# Patient Record
Sex: Male | Born: 1955
Health system: Southern US, Community
[De-identification: ages and names within clinical notes are randomized; demographics above are authoritative.]

## PROBLEM LIST (undated history)

## (undated) DIAGNOSIS — M109 Gout, unspecified: Secondary | ICD-10-CM

## (undated) DIAGNOSIS — I10 Essential (primary) hypertension: Secondary | ICD-10-CM

## (undated) DIAGNOSIS — E119 Type 2 diabetes mellitus without complications: Secondary | ICD-10-CM

---

## 2001-05-12 ENCOUNTER — Ambulatory Visit: Admission: RE | Admit: 2001-05-12 | Discharge: 2001-05-12 | Payer: Self-pay | Admitting: Family Medicine

## 2006-04-19 ENCOUNTER — Ambulatory Visit: Payer: Self-pay | Admitting: Internal Medicine

## 2010-01-18 ENCOUNTER — Emergency Department (HOSPITAL_COMMUNITY)
Admission: EM | Admit: 2010-01-18 | Discharge: 2010-01-18 | Payer: Self-pay | Source: Home / Self Care | Admitting: Emergency Medicine

## 2013-11-01 ENCOUNTER — Telehealth: Payer: Self-pay | Admitting: *Deleted

## 2013-11-01 NOTE — Telephone Encounter (Signed)
Received call from patient requesting an appointment with Dr. Aline Brochure for fluid on right knee. The patient is a patient of Dr. Luna Glasgow, and he has never seen Dr. Aline Brochure before. I advised the patient to go to primary care doctor or the emergency room. I advised that since he was not an ER follow up or have a fracture that Dr. Aline Brochure would not be able to see him today.

## 2013-11-03 ENCOUNTER — Emergency Department (HOSPITAL_COMMUNITY)
Admission: EM | Admit: 2013-11-03 | Discharge: 2013-11-03 | Disposition: A | Discharge status: Home / Self Care | Payer: 59 | Attending: Emergency Medicine | Admitting: Emergency Medicine

## 2013-11-03 ENCOUNTER — Encounter (HOSPITAL_COMMUNITY): Payer: Self-pay | Admitting: Emergency Medicine

## 2013-11-03 DIAGNOSIS — Z79899 Other long term (current) drug therapy: Secondary | ICD-10-CM | POA: Diagnosis not present

## 2013-11-03 DIAGNOSIS — Z791 Long term (current) use of non-steroidal anti-inflammatories (NSAID): Secondary | ICD-10-CM | POA: Insufficient documentation

## 2013-11-03 DIAGNOSIS — M25561 Pain in right knee: Secondary | ICD-10-CM | POA: Diagnosis present

## 2013-11-03 DIAGNOSIS — E119 Type 2 diabetes mellitus without complications: Secondary | ICD-10-CM | POA: Insufficient documentation

## 2013-11-03 DIAGNOSIS — M10061 Idiopathic gout, right knee: Secondary | ICD-10-CM | POA: Insufficient documentation

## 2013-11-03 DIAGNOSIS — M109 Gout, unspecified: Secondary | ICD-10-CM

## 2013-11-03 DIAGNOSIS — I10 Essential (primary) hypertension: Secondary | ICD-10-CM | POA: Diagnosis not present

## 2013-11-03 HISTORY — DX: Gout, unspecified: M10.9

## 2013-11-03 HISTORY — DX: Type 2 diabetes mellitus without complications: E11.9

## 2013-11-03 HISTORY — DX: Essential (primary) hypertension: I10

## 2013-11-03 MED ORDER — KETOROLAC TROMETHAMINE 60 MG/2ML IM SOLN
60.0000 mg | Freq: Once | INTRAMUSCULAR | Status: AC
  Administered 2013-11-03: 60 mg via INTRAMUSCULAR
  Filled 2013-11-03: qty 2

## 2013-11-03 MED ORDER — INDOMETHACIN 50 MG PO CAPS: 50.0000 mg | ORAL_CAPSULE | Freq: Three times a day (TID) | ORAL | Status: DC

## 2013-11-03 NOTE — ED Notes (Signed)
Pt states he has been having increasing pain in his right knee since last week. Pt has history of gout and feels this is the same.

## 2013-11-03 NOTE — Discharge Instructions (Signed)
Gout Gout is when your joints become red, sore, and swell (inflamed). This is caused by the buildup of uric acid crystals in the joints. Uric acid is a chemical that is normally in the blood. If the level of uric acid gets too high in the blood, these crystals form in your joints and tissues. Over time, these crystals can form into masses near the joints and tissues. These masses can destroy bone and cause the bone to look misshapen (deformed). HOME CARE   Do not take aspirin for pain.  Only take medicine as told by your doctor.  Rest the joint as much as you can. When in bed, keep sheets and blankets off painful areas.  Keep the sore joints raised (elevated).  Put warm or cold packs on painful joints. Use of warm or cold packs depends on which works best for you.  Use crutches if the painful joint is in your leg.  Drink enough fluids to keep your pee (urine) clear or pale yellow. Limit alcohol, sugary drinks, and drinks with fructose in them.  Follow your diet instructions. Pay careful attention to how much protein you eat. Include fruits, vegetables, whole grains, and fat-free or low-fat milk products in your daily diet. Talk to your doctor or dietitian about the use of coffee, vitamin C, and cherries. These may help lower uric acid levels.  Keep a healthy body weight. GET HELP RIGHT AWAY IF:   You have watery poop (diarrhea), throw up (vomit), or have any side effects from medicines.  You do not feel better in 24 hours, or you are getting worse.  Your joint becomes suddenly more tender, and you have chills or a fever. MAKE SURE YOU:   Understand these instructions.  Will watch your condition.  Will get help right away if you are not doing well or get worse. Document Released: 10/10/2007 Document Revised: 05/17/2013 Document Reviewed: 08/14/2011 [facility] Patient Information 2015 [place], [place]. This information is not intended to replace advice given to you by your health care  provider. Make sure you discuss any questions you have with your health care provider.  Knee Pain Knee pain can be a result of an injury or other medical conditions. Treatment will depend on the cause of your pain. HOME CARE  Only take medicine as told by your doctor.  Keep a healthy weight. Being overweight can make the knee hurt more.  Stretch before exercising or playing sports.  If there is constant knee pain, change the way you exercise. Ask your doctor for advice.  Make sure shoes fit well. Choose the right shoe for the sport or activity.  Protect your knees. Wear kneepads if needed.  Rest when you are tired. GET HELP RIGHT AWAY IF:   Your knee pain does not stop.  Your knee pain does not get better.  Your knee joint feels hot to the touch.  You have a fever. MAKE SURE YOU:   Understand these instructions.  Will watch this condition.  Will get help right away if you are not doing well or get worse. Document Released: 03/29/2008 Document Revised: 03/25/2011 Document Reviewed: 03/29/2008 [facility] Patient Information 2015 [place], [place]. This information is not intended to replace advice given to you by your health care provider. Make sure you discuss any questions you have with your health care provider.

## 2013-11-05 NOTE — ED Provider Notes (Signed)
CSN: [id number]     Arrival date & time 11/03/13  1323 History   First MD Initiated Contact with Patient 11/03/13 1335     Chief Complaint  Patient presents with  . Knee Pain     (Consider location/radiation/quality/duration/timing/severity/associated sxs/prior Treatment) HPI  Jared Vazquez is a 58 y.o. male who presents to the Emergency Department complaining of right knee pain for one week.  He states that he has hx of gout and this pain feels similar to previous.  He also states that he has been eating foods that have exacerbated his gout in the past.    Pain is worse with movement of the knee and improves at rest.  He denies fever, chills, redness or injury.  He states that allopurinol has not helped with the pain.   Past Medical History  Diagnosis Date  . Gout   . Hypertension   . Diabetes mellitus without complication    History reviewed. No pertinent past surgical history. History reviewed. No pertinent family history. History  Substance Use Topics  . Smoking status: Never Smoker   . Smokeless tobacco: Not on file  . Alcohol Use: No    Review of Systems  Constitutional: Negative for fever and chills.  Genitourinary: Negative for dysuria and difficulty urinating.  Musculoskeletal: Positive for arthralgias. Negative for joint swelling.       Knee pain  Skin: Negative for color change and wound.  All other systems reviewed and are negative.     Allergies  Review of patient's allergies indicates no known allergies.  Home Medications   Prior to Admission medications   Medication Sig Start Date End Date Taking? Authorizing Provider  allopurinol (ZYLOPRIM) 100 MG tablet Take 100 mg by mouth daily.   Yes Historical Provider, MD  metFORMIN (GLUCOPHAGE) 500 MG tablet Take 500 mg by mouth daily with breakfast.   Yes Historical Provider, MD  nebivolol (BYSTOLIC) 10 MG tablet Take 10 mg by mouth daily.   Yes Historical Provider, MD  indomethacin (INDOCIN) 50 MG capsule  Take 1 capsule (50 mg total) by mouth 3 (three) times daily with meals. Discontinue when pain is tolerable 11/03/13   Caidance Sybert L. Timmy Bubeck, PA-C   BP 143/80  Pulse 74  Temp(Src) 98 F (36.7 C) (Oral)  Resp 16  Ht 6' (1.829 m)  Wt 206 lb (93.441 kg)  BMI 27.93 kg/m2  SpO2 100% Physical Exam  Nursing note and vitals reviewed. Constitutional: He is oriented to person, place, and time. He appears well-developed and well-nourished. No distress.  Cardiovascular: Normal rate, regular rhythm, normal heart sounds and intact distal pulses.   No murmur heard. Pulmonary/Chest: Effort normal and breath sounds normal. No respiratory distress.  Musculoskeletal: He exhibits tenderness. He exhibits no edema.  ttp of the right anterior knee.  No erythema, effusion, or step-off deformity.  DP pulse brisk, distal sensation intact. Calf is soft and NT. Pt has full ROM of the knee.    Neurological: He is alert and oriented to person, place, and time. He exhibits normal muscle tone. Coordination normal.  Skin: Skin is warm and dry. No erythema.    ED Course  Procedures (including critical care time) Labs Review Labs Reviewed - No data to display  Imaging Review No results found.   EKG Interpretation None      MDM   Final diagnoses:  Knee pain, acute, right  Gout of right knee, unspecified cause, unspecified chronicity    Patient with h/o gout and reports  that pain to the knee feels same as previous gout flares.  No concerning sx's for septic joint.  Pt has full ROM, NV intact.  He agrees to close f/u with his PMD, rx for indocin and also advised to return here if any worsening sx's develop.  He ambulates with steady gait, appears stable for d/c    Aloria Looper L. Vanessa [place], PA-C 11/05/13 2157

## 2013-11-09 NOTE — ED Provider Notes (Signed)
Medical screening examination/treatment/procedure(s) were performed by non-physician practitioner and as supervising physician I was immediately available for consultation/collaboration.   EKG Interpretation None        Maudry Diego, MD 11/09/13 [phone]

## 2014-12-24 ENCOUNTER — Encounter (HOSPITAL_COMMUNITY): Payer: Self-pay

## 2014-12-24 ENCOUNTER — Emergency Department (HOSPITAL_COMMUNITY)
Admission: EM | Admit: 2014-12-24 | Discharge: 2014-12-24 | Disposition: A | Discharge status: Home / Self Care | Payer: 59 | Attending: Emergency Medicine | Admitting: Emergency Medicine

## 2014-12-24 DIAGNOSIS — H6122 Impacted cerumen, left ear: Secondary | ICD-10-CM | POA: Diagnosis not present

## 2014-12-24 DIAGNOSIS — Z8739 Personal history of other diseases of the musculoskeletal system and connective tissue: Secondary | ICD-10-CM | POA: Diagnosis not present

## 2014-12-24 DIAGNOSIS — J3489 Other specified disorders of nose and nasal sinuses: Secondary | ICD-10-CM | POA: Diagnosis not present

## 2014-12-24 DIAGNOSIS — I10 Essential (primary) hypertension: Secondary | ICD-10-CM | POA: Insufficient documentation

## 2014-12-24 DIAGNOSIS — E119 Type 2 diabetes mellitus without complications: Secondary | ICD-10-CM | POA: Diagnosis not present

## 2014-12-24 DIAGNOSIS — Z79899 Other long term (current) drug therapy: Secondary | ICD-10-CM | POA: Diagnosis not present

## 2014-12-24 DIAGNOSIS — H9202 Otalgia, left ear: Secondary | ICD-10-CM | POA: Diagnosis present

## 2014-12-24 MED ORDER — NEOMYCIN-POLYMYXIN-HC 1 % OT SOLN
3.0000 [drp] | Freq: Four times a day (QID) | OTIC | Status: DC
  Administered 2014-12-24: 3 [drp] via OTIC
  Filled 2014-12-24: qty 10

## 2014-12-24 NOTE — ED Notes (Signed)
This nurse attempted to remove ears wax- Using tepid water and hydrogen peroxide solution. Minimal results- ear becoming more painful for pt - PA Idol informed and in room at this time with pt

## 2014-12-24 NOTE — ED Notes (Signed)
I think I have something in my left ear. I have tried to get it out but I can't and it is hurting. It has been there for a couple of days.

## 2014-12-24 NOTE — ED Notes (Signed)
Discharge instructions reviewed with pt - Discussed how to use ear drops provided - - Pt repeated back instructions - Also informed pt importance of not cleaning ears with q tip and why . Verbalized understanding . Ambulated off unit alone

## 2014-12-24 NOTE — Discharge Instructions (Signed)
Cerumen Impaction The structures of the external ear canal secrete a waxy substance known as cerumen. Excess cerumen can build up in the ear canal, causing a condition known as cerumen impaction. Cerumen impaction can cause ear pain and disrupt the function of the ear. The rate of cerumen production differs for each individual. In certain individuals, the configuration of the ear canal may decrease his or her ability to naturally remove cerumen. CAUSES Cerumen impaction is caused by excessive cerumen production or buildup. RISK FACTORS  Frequent use of swabs to clean ears.  Having narrow ear canals.  Having eczema.  Being dehydrated. SIGNS AND SYMPTOMS  Diminished hearing.  Ear drainage.  Ear pain.  Ear itch. TREATMENT Treatment may involve:  Over-the-counter or prescription ear drops to soften the cerumen.  Removal of cerumen by a health care provider. This may be done with:  Irrigation with warm water. This is the most common method of removal.  Ear curettes and other instruments.  Surgery. This may be done in severe cases. HOME CARE INSTRUCTIONS  Take medicines only as directed by your health care provider.  Do not insert objects into the ear with the intent of cleaning the ear. PREVENTION  Do not insert objects into the ear, even with the intent of cleaning the ear. Removing cerumen as a part of normal hygiene is not necessary, and the use of swabs in the ear canal is not recommended.  Drink enough water to keep your urine clear or pale yellow.  Control your eczema if you have it. SEEK MEDICAL CARE IF:  You develop ear pain.  You develop bleeding from the ear.  The cerumen does not clear after you use ear drops as directed.   This information is not intended to replace advice given to you by your health care provider. Make sure you discuss any questions you have with your health care provider.   Document Released: 02/08/2004 Document Revised: 01/21/2014  Document Reviewed: 08/17/2014 Elsevier Interactive Patient Education 2016 [place] 3 drops of the cortisporin in your left ear every 6 hours for the next 4-5 days.

## 2014-12-26 NOTE — ED Provider Notes (Signed)
CSN: [id number]     Arrival date & time 12/24/14  2020 History   First MD Initiated Contact with Patient 12/24/14 2044     Chief Complaint  Patient presents with  . Foreign Body in [place]     (Consider location/radiation/quality/duration/timing/severity/associated sxs/prior Treatment) The history is provided by the patient.   Jared Vazquez is a 59 y.o. male presenting with a several day history of increasing pain and pressure with decreased hearing acuity in his left ear.  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  There has been no drainage from the ear.  He has tried using a qtip and flushing without success of removing any objects.    Past Medical History  Diagnosis Date  . Gout   . Hypertension   . Diabetes mellitus without complication ([place])    History reviewed. No pertinent past surgical history. No family history on file. Social History  Substance Use Topics  . Smoking status: Never Smoker   . Smokeless tobacco: None  . Alcohol Use: No    Review of Systems  Constitutional: Negative for fever.  HENT: Positive for ear pain and hearing loss. Negative for congestion, ear discharge, rhinorrhea, sinus pressure and sore throat.   Eyes: Negative.   Skin: Negative.  Negative for rash and wound.  Neurological: Negative for dizziness and headaches.      Allergies  Review of patient's allergies indicates no known allergies.  Home Medications   Prior to Admission medications   Medication Sig Start Date End Date Taking? Authorizing Provider  metFORMIN (GLUCOPHAGE) 500 MG tablet Take 500 mg by mouth daily with breakfast.   Yes Historical Provider, MD  nebivolol (BYSTOLIC) 10 MG tablet Take 10 mg by mouth daily.   Yes Historical Provider, MD  TRIBENZOR 40-10-25 MG TABS Take 1 tablet by mouth daily. 11/21/14  Yes Historical Provider, MD   BP 130/80 mmHg  Pulse 72  Temp(Src) 98.1 F (36.7 C) (Oral)  Resp 18  Ht 6' (1.829 m)  Wt 93.441 kg  BMI 27.93  kg/m2  SpO2 98% Physical Exam  Constitutional: He is oriented to person, place, and time. He appears well-developed and well-nourished.  HENT:  Head: Normocephalic and atraumatic.  Right Ear: Ear canal normal.  Left Ear: Tympanic membrane and ear canal normal.  Nose: Mucosal edema and rhinorrhea present.  Mouth/Throat: Uvula is midline, oropharynx is clear and moist and mucous membranes are normal. No oropharyngeal exudate, posterior oropharyngeal edema, posterior oropharyngeal erythema or tonsillar abscesses.  Left cerumen impaction.  Mild external canal erythema without edema. Cerumen in right ear as well without impaction.  Cardiovascular: Normal rate.   Pulmonary/Chest: Effort normal. No respiratory distress.  Neurological: He is alert and oriented to person, place, and time.  Skin: Skin is warm and dry. No rash noted.    ED Course  .Ear Cerumen Removal Date/Time: 12/25/2014 10:00 PM Performed by: Evalee Jefferson Authorized by: Evalee Jefferson Risks and benefits: risks, benefits and alternatives were discussed Patient identity confirmed: verbally with patient Time out: Immediately prior to procedure a "time out" was called to verify the correct patient, procedure, equipment, support staff and site/side marked as required. Local anesthetic: none Ceruminolytics applied: Ceruminolytics applied prior to the procedure. Location details: left ear Procedure type: irrigation Patient sedated: no Patient tolerance: Patient tolerated the procedure well with no immediate complications Comments: Moderate cerumen removed after multiple flushes using warm tap water.  Small amount of residual cerumen left, TM  visualized and normal.  Erythema without edema of external canal.  Hearing improved.   (including critical care time) Labs Review Labs Reviewed - No data to display  Imaging Review No results found. I have personally reviewed and evaluated these images and lab results as part of my medical  decision-making.   EKG Interpretation None      MDM   Final diagnoses:  Cerumen impaction, left    Pt tolerated flushing well. Hearing improved. Given external canal inflammation, cortisporin gtts applied and given pt for use for the next several days.  Prn f/u anticipated. Advised to avoid using qtips.    Evalee Jefferson, PA-C 12/26/14 1358  Merrily Pew, MD 12/27/14 1126

## 2015-01-31 DIAGNOSIS — I10 Essential (primary) hypertension: Secondary | ICD-10-CM | POA: Diagnosis not present

## 2015-01-31 DIAGNOSIS — E118 Type 2 diabetes mellitus with unspecified complications: Secondary | ICD-10-CM | POA: Diagnosis not present

## 2015-01-31 DIAGNOSIS — M791 Myalgia: Secondary | ICD-10-CM | POA: Diagnosis not present

## 2015-01-31 MED FILL — COLCHICINE 0.6 MG TABLET: 0.6 | 10 days supply | Qty: 20 | Fill #0

## 2015-01-31 MED FILL — ALLOPURINOL 100 MG TABLET: 100 | 30 days supply | Qty: 30 | Fill #0

## 2015-01-31 MED FILL — HYDROCOD-IBU 7.5-200 TAB: 7.5-200 | 15 days supply | Qty: 60 | Fill #0

## 2015-01-31 MED FILL — CYCLOBENZAPRINE 5 MG TABLET: 5 | 10 days supply | Qty: 30 | Fill #0

## 2015-02-06 DIAGNOSIS — M5416 Radiculopathy, lumbar region: Secondary | ICD-10-CM | POA: Diagnosis not present

## 2015-02-06 DIAGNOSIS — M5136 Other intervertebral disc degeneration, lumbar region: Secondary | ICD-10-CM | POA: Diagnosis not present

## 2015-02-06 DIAGNOSIS — M62830 Muscle spasm of back: Secondary | ICD-10-CM | POA: Diagnosis not present

## 2015-02-06 DIAGNOSIS — M9903 Segmental and somatic dysfunction of lumbar region: Secondary | ICD-10-CM | POA: Diagnosis not present

## 2015-03-13 MED FILL — metFORMIN HCL 500 MG TABS: 500 | 90 days supply | Qty: 90 | Fill #0

## 2015-03-13 MED FILL — OLMSRTN-AMLDPN-HCTZ 40-10-2: 40-10-25 | 90 days supply | Qty: 90 | Fill #0

## 2015-03-13 MED FILL — ALLOPURINOL 100 MG TABLET: 100 | 30 days supply | Qty: 30 | Fill #1

## 2015-03-16 MED FILL — BYSTOLIC 10 MG TABLET: 10 | 90 days supply | Qty: 180 | Fill #0

## 2015-06-30 MED FILL — BYSTOLIC 10 MG TABLET: 10 | 90 days supply | Qty: 180 | Fill #1

## 2015-06-30 MED FILL — OLMSRTN-AMLDPN-HCTZ 40-10-2: 40-10-25 | 90 days supply | Qty: 90 | Fill #1

## 2015-06-30 MED FILL — metFORMIN HCL 500 MG TABS: 500 | 90 days supply | Qty: 90 | Fill #1

## 2015-07-10 ENCOUNTER — Emergency Department (HOSPITAL_COMMUNITY): Payer: 59

## 2015-07-10 ENCOUNTER — Encounter (HOSPITAL_COMMUNITY): Payer: Self-pay | Admitting: Emergency Medicine

## 2015-07-10 ENCOUNTER — Other Ambulatory Visit: Payer: Self-pay

## 2015-07-10 ENCOUNTER — Emergency Department (HOSPITAL_COMMUNITY)
Admission: EM | Admit: 2015-07-10 | Discharge: 2015-07-10 | Disposition: A | Discharge status: Home / Self Care | Payer: 59 | Attending: Emergency Medicine | Admitting: Emergency Medicine

## 2015-07-10 DIAGNOSIS — Z7984 Long term (current) use of oral hypoglycemic drugs: Secondary | ICD-10-CM | POA: Insufficient documentation

## 2015-07-10 DIAGNOSIS — E119 Type 2 diabetes mellitus without complications: Secondary | ICD-10-CM | POA: Diagnosis not present

## 2015-07-10 DIAGNOSIS — R42 Dizziness and giddiness: Secondary | ICD-10-CM | POA: Insufficient documentation

## 2015-07-10 DIAGNOSIS — I10 Essential (primary) hypertension: Secondary | ICD-10-CM | POA: Diagnosis not present

## 2015-07-10 DIAGNOSIS — Z79899 Other long term (current) drug therapy: Secondary | ICD-10-CM | POA: Insufficient documentation

## 2015-07-10 LAB — CBC WITH DIFFERENTIAL/PLATELET
BASOS PCT: 0 %
Basophils Absolute: 0 10*3/uL (ref 0.0–0.1)
Eosinophils Absolute: 0.3 10*3/uL (ref 0.0–0.7)
Eosinophils Relative: 3 %
HEMATOCRIT: 45.4 % (ref 39.0–52.0)
HEMOGLOBIN: 15.3 g/dL (ref 13.0–17.0)
LYMPHS ABS: 1.8 10*3/uL (ref 0.7–4.0)
LYMPHS PCT: 22 %
MCH: 29.2 pg (ref 26.0–34.0)
MCHC: 33.7 g/dL (ref 30.0–36.0)
MCV: 86.6 fL (ref 78.0–100.0)
MONO ABS: 0.6 10*3/uL (ref 0.1–1.0)
MONOS PCT: 7 %
NEUTROS ABS: 5.6 10*3/uL (ref 1.7–7.7)
Neutrophils Relative %: 68 %
Platelets: 239 10*3/uL (ref 150–400)
RBC: 5.24 MIL/uL (ref 4.22–5.81)
RDW: 12.8 % (ref 11.5–15.5)
WBC: 8.2 10*3/uL (ref 4.0–10.5)

## 2015-07-10 LAB — I-STAT TROPONIN, ED: Troponin i, poc: 0.01 ng/mL (ref 0.00–0.08)

## 2015-07-10 LAB — BASIC METABOLIC PANEL
Anion gap: 5 (ref 5–15)
BUN: 20 mg/dL (ref 6–20)
CALCIUM: 9 mg/dL (ref 8.9–10.3)
CHLORIDE: 100 mmol/L — AB (ref 101–111)
CO2: 31 mmol/L (ref 22–32)
CREATININE: 1.56 mg/dL — AB (ref 0.61–1.24)
GFR calc Af Amer: 54 mL/min — ABNORMAL LOW (ref >60)
GFR calc non Af Amer: 47 mL/min — ABNORMAL LOW (ref >60)
GLUCOSE: 193 mg/dL — AB (ref 65–99)
Potassium: 3.6 mmol/L (ref 3.5–5.1)
Sodium: 136 mmol/L (ref 135–145)

## 2015-07-10 LAB — HEPATIC FUNCTION PANEL
ALK PHOS: 66 U/L (ref 38–126)
ALT: 12 U/L — AB (ref 17–63)
AST: 15 U/L (ref 15–41)
Albumin: 3.8 g/dL (ref 3.5–5.0)
BILIRUBIN DIRECT: 0.2 mg/dL (ref 0.1–0.5)
BILIRUBIN INDIRECT: 0.8 mg/dL (ref 0.3–0.9)
Total Bilirubin: 1 mg/dL (ref 0.3–1.2)
Total Protein: 7.5 g/dL (ref 6.5–8.1)

## 2015-07-10 MED ORDER — ONDANSETRON HCL 4 MG/2ML IJ SOLN
4.0000 mg | Freq: Once | INTRAMUSCULAR | Status: AC
  Administered 2015-07-10: 4 mg via INTRAVENOUS
  Filled 2015-07-10: qty 2

## 2015-07-10 MED ORDER — MECLIZINE HCL 25 MG PO TABS: 25.0000 mg | ORAL_TABLET | Freq: Three times a day (TID) | ORAL | Status: DC | PRN

## 2015-07-10 MED ORDER — MECLIZINE HCL 12.5 MG PO TABS
25.0000 mg | ORAL_TABLET | Freq: Once | ORAL | Status: AC
  Administered 2015-07-10: 25 mg via ORAL
  Filled 2015-07-10: qty 2

## 2015-07-10 MED ORDER — ONDANSETRON 4 MG PO TBDP: ORAL_TABLET | ORAL | Status: DC

## 2015-07-10 MED ORDER — ONDANSETRON 4 MG PO TBDP
ORAL_TABLET | ORAL | Status: DC
Start: 2015-07-10 — End: 2017-09-16

## 2015-07-10 MED ORDER — SODIUM CHLORIDE 0.9 % IV BOLUS (SEPSIS)
1000.0000 mL | Freq: Once | INTRAVENOUS | Status: AC
  Administered 2015-07-10: 1000 mL via INTRAVENOUS

## 2015-07-10 NOTE — ED Provider Notes (Addendum)
CSN: [id number]     Arrival date & time 07/10/15  1133 History  By signing my name below, I, Irene Pap, attest that this documentation has been prepared under the direction and in the presence of Milton Ferguson, MD. Electronically Signed: Irene Pap, ED Scribe. 07/10/2015. 12:17 PM.   Chief Complaint  Patient presents with  . Dizziness   Patient is a 60 y.o. male presenting with dizziness. The history is provided by the patient (Patient states that he started today feeling like the room was spinning). No language interpreter was used.  Dizziness Quality:  Room spinning and head spinning Severity:  Moderate Onset quality:  Sudden Duration:  1 day Timing:  Constant Progression:  Worsening Chronicity:  New Context: standing up   Ineffective treatments:  None tried Associated symptoms: no chest pain, no diarrhea, no headaches, no nausea and no vomiting   HPI Comments: Jared Vazquez is a 60 y.o. Male with a hx of HTN and DM who presents to the Emergency Department complaining of room spinning dizziness onset one day ago. He reports worsening dizziness with standing. Pt denies hx of similar symptoms. He has not taken anything for his symptoms. He denies nausea or vomiting.   Past Medical History  Diagnosis Date  . Gout   . Hypertension   . Diabetes mellitus without complication ([place])    History reviewed. No pertinent past surgical history. History reviewed. No pertinent family history. Social History  Substance Use Topics  . Smoking status: Never Smoker   . Smokeless tobacco: None  . Alcohol Use: Yes     Comment: occasionally    Review of Systems  Constitutional: Negative for appetite change and fatigue.  HENT: Negative for congestion, ear discharge and sinus pressure.   Eyes: Negative for discharge.  Respiratory: Negative for cough.   Cardiovascular: Negative for chest pain.  Gastrointestinal: Negative for nausea, vomiting, abdominal pain and diarrhea.  Genitourinary:  Negative for frequency and hematuria.  Musculoskeletal: Negative for back pain and gait problem.  Skin: Negative for rash.  Neurological: Positive for dizziness. Negative for seizures and headaches.  Psychiatric/Behavioral: Negative for hallucinations.   Allergies  Review of patient's allergies indicates no known allergies.  Home Medications   Prior to Admission medications   Medication Sig Start Date End Date Taking? Authorizing Provider  metFORMIN (GLUCOPHAGE) 500 MG tablet Take 500 mg by mouth daily with breakfast.    Historical Provider, MD  nebivolol (BYSTOLIC) 10 MG tablet Take 10 mg by mouth daily.    Historical Provider, MD  TRIBENZOR 40-10-25 MG TABS Take 1 tablet by mouth daily. 11/21/14   Historical Provider, MD   BP 142/94 mmHg  Pulse 55  Temp(Src) 98 F (36.7 C) (Oral)  Resp 18  Ht 6' (1.829 m)  Wt 204 lb (92.534 kg)  BMI 27.66 kg/m2  SpO2 100% Physical Exam  Constitutional: He is oriented to person, place, and time. He appears well-developed.  HENT:  Head: Normocephalic.  Eyes: Conjunctivae and EOM are normal. No scleral icterus.  Neck: Neck supple. No thyromegaly present.  Cardiovascular: Normal rate and regular rhythm.  Exam reveals no gallop and no friction rub.   No murmur heard. Pulmonary/Chest: No stridor. He has no wheezes. He has no rales. He exhibits no tenderness.  Abdominal: He exhibits no distension. There is no tenderness. There is no rebound.  Musculoskeletal: Normal range of motion. He exhibits no edema.  Lymphadenopathy:    He has no cervical adenopathy.  Neurological: He is oriented  to person, place, and time. He exhibits normal muscle tone. Coordination normal.  Skin: No rash noted. No erythema.  Psychiatric: He has a normal mood and affect. His behavior is normal.    ED Course  Procedures (including critical care time) DIAGNOSTIC STUDIES: Oxygen Saturation is 100% on RA, normal by my interpretation.    COORDINATION OF CARE: 12:16  PM-Discussed treatment plan which includes labs and EKG with pt at bedside and pt agreed to plan.    Labs Review Labs Reviewed  CBC WITH DIFFERENTIAL/PLATELET  BASIC METABOLIC PANEL    Imaging Review No results found. I have personally reviewed and evaluated these images and lab results as part of my medical decision-making.   EKG Interpretation   Date/Time:  Monday July 10 2015 11:50:02 EDT Ventricular Rate:  53 PR Interval:  142 QRS Duration: 84 QT Interval:  418 QTC Calculation: 392 R Axis:   3 Text Interpretation:  Sinus bradycardia Nonspecific T wave abnormality  Abnormal ECG Confirmed by Raider Valbuena  MD, Leldon Steege ([id number]) on 07/10/2015 12:07:35  PM      MDM   Final diagnoses:  None    Labs EKG CT of the head all unremarkable. Patient improved with Antivert. Will treat patient for vertigo with Antivert and he will follow-up with his PCP.  The chart was scribed for me under my direct supervision.  I personally performed the history, physical, and medical decision making and all procedures in the evaluation of this patient.Milton Ferguson, MD 07/10/15 1707  Milton Ferguson, MD 07/10/15 [phone]

## 2015-07-10 NOTE — ED Notes (Signed)
Patient complaining of dizziness since yesterday. Denies any other symptoms. Patient has symmetrical facial movements, equal strength bilaterally.

## 2015-07-10 NOTE — ED Notes (Signed)
EDP at bedside  

## 2015-07-10 NOTE — Discharge Instructions (Signed)
Follow-up with your family doctor later this week for recheck

## 2015-07-10 NOTE — ED Notes (Signed)
Pt given pre pack of zofran at discharge per order of EDP.

## 2015-07-17 MED FILL — Ondansetron HCl Tab 4 MG: ORAL | Qty: 4 | Status: AC

## 2015-07-25 DIAGNOSIS — H52221 Regular astigmatism, right eye: Secondary | ICD-10-CM | POA: Diagnosis not present

## 2015-07-25 DIAGNOSIS — H524 Presbyopia: Secondary | ICD-10-CM | POA: Diagnosis not present

## 2015-07-25 DIAGNOSIS — H5213 Myopia, bilateral: Secondary | ICD-10-CM | POA: Diagnosis not present

## 2015-07-25 DIAGNOSIS — H2513 Age-related nuclear cataract, bilateral: Secondary | ICD-10-CM | POA: Diagnosis not present

## 2015-08-07 DIAGNOSIS — M1A00X0 Idiopathic chronic gout, unspecified site, without tophus (tophi): Secondary | ICD-10-CM | POA: Diagnosis not present

## 2015-08-07 DIAGNOSIS — M25561 Pain in right knee: Secondary | ICD-10-CM | POA: Diagnosis not present

## 2015-08-07 DIAGNOSIS — M25571 Pain in right ankle and joints of right foot: Secondary | ICD-10-CM | POA: Diagnosis not present

## 2015-08-07 DIAGNOSIS — I10 Essential (primary) hypertension: Secondary | ICD-10-CM | POA: Diagnosis not present

## 2015-08-07 DIAGNOSIS — M25562 Pain in left knee: Secondary | ICD-10-CM | POA: Diagnosis not present

## 2015-08-07 DIAGNOSIS — E118 Type 2 diabetes mellitus with unspecified complications: Secondary | ICD-10-CM | POA: Diagnosis not present

## 2015-08-07 MED FILL — ALLOPURINOL 100 MG TABLET: 100 | 30 days supply | Qty: 30 | Fill #2

## 2015-08-07 MED FILL — METHYLPREDNISOLONE 4 MG TAB: 4 | 6 days supply | Qty: 21 | Fill #0

## 2015-10-11 MED FILL — ALLOPURINOL 100 MG TABLET: 100 | 30 days supply | Qty: 30 | Fill #0

## 2015-10-11 MED FILL — BYSTOLIC 20 MG TABLET: 20 | 30 days supply | Qty: 30 | Fill #0

## 2015-10-12 MED FILL — OLMSRTN-AMLDPN-HCTZ 40-10-2: 40-10-25 | 90 days supply | Qty: 90 | Fill #0

## 2015-10-18 MED FILL — metFORMIN HCL 500 MG TABS: 500 | 90 days supply | Qty: 90 | Fill #0

## 2015-11-16 MED FILL — BYSTOLIC 20 MG TABLET: 20 | 90 days supply | Qty: 90 | Fill #1

## 2016-01-19 MED FILL — metFORMIN HCL 500 MG TABS: 500 | 90 days supply | Qty: 90 | Fill #1

## 2016-01-19 MED FILL — ALLOPURINOL 100 MG TABLET: 100 | 30 days supply | Qty: 30 | Fill #1

## 2016-01-19 MED FILL — OLMSRTN-AMLDPN-HCTZ 40-10-2: 40-10-25 | 90 days supply | Qty: 90 | Fill #1

## 2016-04-16 MED FILL — ALLOPURINOL 100 MG TABLET: 100 | 30 days supply | Qty: 30 | Fill #2

## 2016-04-16 MED FILL — BYSTOLIC 10 MG TABLET: 10 | 30 days supply | Qty: 60 | Fill #0

## 2016-06-03 MED FILL — ALLOPURINOL 100 MG TABLET: 100 | 30 days supply | Qty: 30 | Fill #3

## 2016-06-03 MED FILL — OLMSRTN-AMLDPN-HCTZ 40-10-2: 40-10-25 | 90 days supply | Qty: 90 | Fill #0

## 2016-06-11 DIAGNOSIS — I10 Essential (primary) hypertension: Secondary | ICD-10-CM | POA: Diagnosis not present

## 2016-06-11 DIAGNOSIS — E118 Type 2 diabetes mellitus with unspecified complications: Secondary | ICD-10-CM | POA: Diagnosis not present

## 2016-06-11 DIAGNOSIS — B353 Tinea pedis: Secondary | ICD-10-CM | POA: Diagnosis not present

## 2016-06-12 MED FILL — metFORMIN HCL 500 MG TABS: 500 | 90 days supply | Qty: 90 | Fill #0

## 2016-06-12 MED FILL — predniSONE 5 MG TABS: 5 | 6 days supply | Qty: 21 | Fill #0

## 2016-06-12 MED FILL — CLOTRIMAZOLE-BETAMETHASONE: 1-0.05 | 10 days supply | Qty: 15 | Fill #0

## 2016-06-12 MED FILL — BYSTOLIC 20 MG TABLET: 20 | 90 days supply | Qty: 90 | Fill #0

## 2016-06-12 MED FILL — COLCHICINE 0.6 MG TABLET: 0.6 | 10 days supply | Qty: 20 | Fill #0

## 2016-08-07 DIAGNOSIS — M25511 Pain in right shoulder: Secondary | ICD-10-CM | POA: Diagnosis not present

## 2016-08-07 DIAGNOSIS — B353 Tinea pedis: Secondary | ICD-10-CM | POA: Diagnosis not present

## 2016-08-07 DIAGNOSIS — R634 Abnormal weight loss: Secondary | ICD-10-CM | POA: Diagnosis not present

## 2016-08-07 DIAGNOSIS — E118 Type 2 diabetes mellitus with unspecified complications: Secondary | ICD-10-CM | POA: Diagnosis not present

## 2016-08-07 DIAGNOSIS — I10 Essential (primary) hypertension: Secondary | ICD-10-CM | POA: Diagnosis not present

## 2016-08-07 DIAGNOSIS — R05 Cough: Secondary | ICD-10-CM | POA: Diagnosis not present

## 2016-08-07 MED FILL — PRAVASTATIN NA 10 MG TAB: 10 | 30 days supply | Qty: 30 | Fill #0

## 2016-08-12 ENCOUNTER — Other Ambulatory Visit (HOSPITAL_COMMUNITY): Payer: Self-pay | Admitting: Family Medicine

## 2016-08-12 ENCOUNTER — Other Ambulatory Visit: Payer: Self-pay | Admitting: Family Medicine

## 2016-08-12 DIAGNOSIS — R634 Abnormal weight loss: Secondary | ICD-10-CM

## 2016-08-19 ENCOUNTER — Ambulatory Visit: Payer: 59

## 2016-08-19 ENCOUNTER — Ambulatory Visit (HOSPITAL_COMMUNITY): Payer: 59

## 2016-08-19 DIAGNOSIS — Z1211 Encounter for screening for malignant neoplasm of colon: Secondary | ICD-10-CM | POA: Diagnosis not present

## 2016-08-19 MED FILL — SUPREP BOWEL PREP KIT: 17.5-3.13-1 | 1 days supply | Qty: 354 | Fill #0

## 2016-08-21 ENCOUNTER — Ambulatory Visit (HOSPITAL_COMMUNITY)
Admission: RE | Admit: 2016-08-21 | Discharge: 2016-08-21 | Disposition: A | Discharge status: Home / Self Care | Payer: 59 | Source: Ambulatory Visit | Attending: Family Medicine | Admitting: Family Medicine

## 2016-08-21 DIAGNOSIS — R634 Abnormal weight loss: Secondary | ICD-10-CM

## 2016-08-21 MED ORDER — BARIUM SULFATE 2.1 % PO SUSP
ORAL | Status: AC
  Filled 2016-08-21: qty 2

## 2016-08-27 DIAGNOSIS — D123 Benign neoplasm of transverse colon: Secondary | ICD-10-CM | POA: Diagnosis not present

## 2016-08-27 DIAGNOSIS — K6389 Other specified diseases of intestine: Secondary | ICD-10-CM | POA: Diagnosis not present

## 2016-08-27 DIAGNOSIS — D120 Benign neoplasm of cecum: Secondary | ICD-10-CM | POA: Diagnosis not present

## 2016-08-27 DIAGNOSIS — K635 Polyp of colon: Secondary | ICD-10-CM | POA: Diagnosis not present

## 2016-08-27 DIAGNOSIS — Z1211 Encounter for screening for malignant neoplasm of colon: Secondary | ICD-10-CM | POA: Diagnosis not present

## 2016-08-27 DIAGNOSIS — D122 Benign neoplasm of ascending colon: Secondary | ICD-10-CM | POA: Diagnosis not present

## 2016-08-28 MED FILL — metFORMIN HCL 500 MG TABS: 500 | 90 days supply | Qty: 90 | Fill #1

## 2016-09-06 ENCOUNTER — Encounter (HOSPITAL_COMMUNITY): Payer: Self-pay

## 2016-09-06 ENCOUNTER — Ambulatory Visit (HOSPITAL_COMMUNITY)
Admission: RE | Admit: 2016-09-06 | Discharge: 2016-09-06 | Disposition: A | Discharge status: Home / Self Care | Payer: 59 | Source: Ambulatory Visit | Attending: Family Medicine | Admitting: Family Medicine

## 2016-09-26 ENCOUNTER — Encounter: Payer: Self-pay | Admitting: Orthopaedic Surgery

## 2016-09-26 ENCOUNTER — Other Ambulatory Visit: Payer: 59

## 2016-09-26 ENCOUNTER — Ambulatory Visit (INDEPENDENT_AMBULATORY_CARE_PROVIDER_SITE_OTHER): Payer: 59 | Admitting: Orthopaedic Surgery

## 2016-09-26 ENCOUNTER — Ambulatory Visit (INDEPENDENT_AMBULATORY_CARE_PROVIDER_SITE_OTHER): Payer: 59

## 2016-09-26 VITALS — BP 133/86 | HR 60 | Temp 96.9°F | Ht 71.0 in | Wt 193.0 lb

## 2016-09-26 DIAGNOSIS — G8929 Other chronic pain: Secondary | ICD-10-CM

## 2016-09-26 DIAGNOSIS — M25511 Pain in right shoulder: Secondary | ICD-10-CM

## 2016-09-26 MED ORDER — NAPROXEN 500 MG PO TABS: 500.0000 mg | ORAL_TABLET | Freq: Two times a day (BID) | ORAL | 5 refills | Status: AC

## 2016-09-26 MED FILL — NAPROXEN 500 MG TABLET: 500 | 30 days supply | Qty: 60 | Fill #0

## 2016-09-26 NOTE — Progress Notes (Signed)
Subjective:    Patient ID: Jared Vazquez, male    DOB: October 03, 1955, 61 y.o.   MRN: [id number]  HPI He began having right shoulder pain about two months ago after lifting a heavy object.  It hurt a day or two and got better slightly.  Since then he has had increasing tenderness so that now it hurts again.  He has difficulty sleeping at night rolling over on it.  He has no swelling, no numbness, no weakness.  He has pain with overhead use.  He has noticed he cannot raise his arm over his shoulder at all now.  He has no neck pain, no left shoulder pain.  He has used ice and heat but no medicine at all.   Review of Systems  HENT: Negative for congestion.   Respiratory: Negative for cough and shortness of breath.   Cardiovascular: Negative for chest pain and leg swelling.  Endocrine: Negative for cold intolerance.  Musculoskeletal: Positive for arthralgias.  Allergic/Immunologic: Negative for environmental allergies.   Past Medical History:  Diagnosis Date  . Diabetes mellitus without complication ([place])   . Gout   . Hypertension     History reviewed. No pertinent surgical history.  Current Outpatient Prescriptions on File Prior to Visit  Medication Sig Dispense Refill  . meclizine (ANTIVERT) 25 MG tablet Take 1 tablet (25 mg total) by mouth 3 (three) times daily as needed for dizziness. 15 tablet 0  . metFORMIN (GLUCOPHAGE) 500 MG tablet Take 500 mg by mouth daily with breakfast.    . nebivolol (BYSTOLIC) 10 MG tablet Take 10 mg by mouth daily.    . ondansetron (ZOFRAN ODT) 4 MG disintegrating tablet 4mg  ODT q4 hours prn nausea/vomit 12 tablet 0  . ondansetron (ZOFRAN ODT) 4 MG disintegrating tablet 4mg  ODT q4 hours prn nausea/vomit 6 tablet 0  . TRIBENZOR 40-10-25 MG TABS Take 1 tablet by mouth daily.  1   No current facility-administered medications on file prior to visit.     Social History   Social History  . Marital status: Married    Spouse name: N/A  . Number of children:  N/A  . Years of education: N/A   Occupational History  . Not on file.   Social History Main Topics  . Smoking status: Never Smoker  . Smokeless tobacco: Never Used  . Alcohol use Yes     Comment: occasionally  . Drug use: No  . Sexual activity: Not on file   Other Topics Concern  . Not on file   Social History Narrative  . No narrative on file    History reviewed. No pertinent family history.  BP 133/86   Pulse 60   Temp (!) 96.9 F (36.1 C)   Ht 5\' 11"  (1.803 m)   Wt 193 lb (87.5 kg)   BMI 26.92 kg/m      Objective:   Physical Exam  Constitutional: He is oriented to person, place, and time. He appears well-developed and well-nourished.  HENT:  Head: Normocephalic and atraumatic.  Eyes: Pupils are equal, round, and reactive to light. Conjunctivae and EOM are normal.  Neck: Normal range of motion. Neck supple.  Cardiovascular: Normal rate, regular rhythm and intact distal pulses.   Pulmonary/Chest: Effort normal.  Abdominal: Soft.  Musculoskeletal: He exhibits tenderness (Pain right shoulder, NV intact, ROM abduction 75, forward 90 with pain, adduction full, extension 20, internal only 20, external 35.  Neck motion is full.  Left shoudler is full.  Grips  are normal.).  Neurological: He is alert and oriented to person, place, and time. He has normal reflexes. He displays normal reflexes. No cranial nerve deficit. He exhibits normal muscle tone. Coordination normal.  Skin: Skin is warm and dry.  Psychiatric: He has a normal mood and affect. His behavior is normal. Judgment and thought content normal.  Vitals reviewed.   X-rays were done of the right shoulder, reported separately.      Assessment & Plan:   Encounter Diagnosis  Name Primary?  . Chronic pain in right shoulder Yes   PROCEDURE NOTE:  The patient request injection, verbal consent was obtained.  The right shoulder was prepped appropriately after time out was performed.   Sterile technique was  observed and injection of 1 cc of Depo-Medrol 40 mg with several cc's of plain xylocaine. Anesthesia was provided by ethyl chloride and a 20-gauge needle was used to inject the shoulder area. A posterior approach was used.  The injection was tolerated well.  A band aid dressing was applied.  The patient was advised to apply ice later today and tomorrow to the injection sight as needed.  I will begin Naprosyn one po bid pc  Return in three weeks.  Call if any problem.  Precautions discussed.    Electronically Signed Sanjuana Kava, MD 9/13/20183:37 PM

## 2016-09-30 MED FILL — BYSTOLIC 20 MG TABLET: 20 | 30 days supply | Qty: 30 | Fill #1

## 2016-09-30 MED FILL — OLMSRTN-AMLDPN-HCTZ 40-10-2: 40-10-25 | 30 days supply | Qty: 30 | Fill #0

## 2016-10-17 ENCOUNTER — Ambulatory Visit: Payer: 59 | Admitting: Orthopaedic Surgery

## 2016-10-22 ENCOUNTER — Encounter: Payer: Self-pay | Admitting: Orthopaedic Surgery

## 2016-11-27 MED FILL — BYSTOLIC 20 MG TABLET: 20 | 90 days supply | Qty: 90 | Fill #0

## 2016-11-27 MED FILL — metFORMIN HCL 500 MG TABS: 500 | 90 days supply | Qty: 90 | Fill #0

## 2017-01-21 DIAGNOSIS — E119 Type 2 diabetes mellitus without complications: Secondary | ICD-10-CM | POA: Diagnosis not present

## 2017-01-21 DIAGNOSIS — R21 Rash and other nonspecific skin eruption: Secondary | ICD-10-CM | POA: Diagnosis not present

## 2017-01-21 DIAGNOSIS — I10 Essential (primary) hypertension: Secondary | ICD-10-CM | POA: Diagnosis not present

## 2017-01-22 MED FILL — OLMSRTN-AMLDPN-HCTZ 40-10-2: 40-10-25 | 90 days supply | Qty: 90 | Fill #0

## 2017-02-05 MED FILL — NAPROXEN 500 MG TABLET: 500 | 30 days supply | Qty: 60 | Fill #1

## 2017-02-07 MED FILL — metFORMIN HCL 500 MG TABS: 500 | 90 days supply | Qty: 90 | Fill #1

## 2017-03-10 MED FILL — BYSTOLIC 20 MG TABLET: 20 | 30 days supply | Qty: 30 | Fill #1

## 2017-03-10 MED FILL — NAPROXEN 500 MG TABLET: 500 | 30 days supply | Qty: 60 | Fill #2

## 2017-04-09 MED FILL — metFORMIN HCL 500 MG TABS: 500 | 90 days supply | Qty: 180 | Fill #0

## 2017-05-12 MED FILL — BYSTOLIC 20 MG TABLET: 20 | 90 days supply | Qty: 90 | Fill #0

## 2017-05-20 MED FILL — OLMSRTN-AMLDPN-HCTZ 40-10-2: 40-10-25 | 90 days supply | Qty: 90 | Fill #1

## 2017-06-10 MED FILL — ALLOPURINOL 100 MG TABLET: 100 | 30 days supply | Qty: 30 | Fill #0

## 2017-07-28 MED FILL — BYSTOLIC 20 MG TABLET: 20 | 30 days supply | Qty: 30 | Fill #1

## 2017-08-14 MED FILL — metFORMIN HCL 500 MG TABS: 500 | 90 days supply | Qty: 180 | Fill #1

## 2017-09-09 MED FILL — OLMSRTN-AMLDPN-HCTZ 40-10-2: 40-10-25 | 30 days supply | Qty: 30 | Fill #0

## 2017-09-16 ENCOUNTER — Ambulatory Visit (INDEPENDENT_AMBULATORY_CARE_PROVIDER_SITE_OTHER): Payer: 59

## 2017-09-16 ENCOUNTER — Encounter: Payer: Self-pay | Admitting: Orthopaedic Surgery

## 2017-09-16 ENCOUNTER — Ambulatory Visit: Payer: 59 | Admitting: Orthopaedic Surgery

## 2017-09-16 VITALS — BP 131/91 | HR 76 | Ht 72.0 in | Wt 191.0 lb

## 2017-09-16 DIAGNOSIS — E119 Type 2 diabetes mellitus without complications: Secondary | ICD-10-CM | POA: Diagnosis not present

## 2017-09-16 DIAGNOSIS — E118 Type 2 diabetes mellitus with unspecified complications: Secondary | ICD-10-CM | POA: Diagnosis not present

## 2017-09-16 DIAGNOSIS — M25562 Pain in left knee: Secondary | ICD-10-CM

## 2017-09-16 DIAGNOSIS — I10 Essential (primary) hypertension: Secondary | ICD-10-CM | POA: Diagnosis not present

## 2017-09-16 DIAGNOSIS — M25511 Pain in right shoulder: Secondary | ICD-10-CM

## 2017-09-16 DIAGNOSIS — G8929 Other chronic pain: Secondary | ICD-10-CM

## 2017-09-16 DIAGNOSIS — Z6826 Body mass index (BMI) 26.0-26.9, adult: Secondary | ICD-10-CM | POA: Diagnosis not present

## 2017-09-16 DIAGNOSIS — M1A00X0 Idiopathic chronic gout, unspecified site, without tophus (tophi): Secondary | ICD-10-CM | POA: Diagnosis not present

## 2017-09-16 NOTE — Progress Notes (Signed)
Patient Jared Vazquez, male DOB:03/13/1955, 62 y.o. NAT:[id number]  Chief Complaint  Patient presents with  . Knee Pain    left   . Shoulder Pain    right     HPI  Jared Vazquez is a 62 y.o. male who has developed pain in the left knee over the last several months. He has had swelling and popping but no giving way, no trauma, no redness.  Nothing seems to help. It got better over the Labor Day holiday as he did nothing.  He has pain in the right shoulder again. He has pain with overhead use.  He has no numbness, no trauma.    Body mass index is 25.9 kg/m.  ROS  Review of Systems  Constitutional: Positive for activity change.  HENT: Negative for congestion.   Respiratory: Negative for cough and shortness of breath.   Cardiovascular: Negative for chest pain and leg swelling.  Endocrine: Negative for cold intolerance.  Musculoskeletal: Positive for arthralgias, gait problem and joint swelling.  Allergic/Immunologic: Negative for environmental allergies.  All other systems reviewed and are negative.   All other systems reviewed and are negative.  The following is a summary of the past history medically, past history surgically, known current medicines, social history and family history.  This information is gathered electronically by the computer from prior information and documentation.  I review this each visit and have found including this information at this point in the chart is beneficial and informative.    Past Medical History:  Diagnosis Date  . Diabetes mellitus without complication ([place])   . Gout   . Hypertension     History reviewed. No pertinent surgical history.  Family History  Problem Relation Age of Onset  . High blood pressure Mother   . High blood pressure Father     Social History Social History   Tobacco Use  . Smoking status: Never Smoker  . Smokeless tobacco: Never Used  Substance Use Topics  . Alcohol use: Yes    Comment: occasionally  .  Drug use: No    No Known Allergies  Current Outpatient Medications  Medication Sig Dispense Refill  . metFORMIN (GLUCOPHAGE) 500 MG tablet Take 500 mg by mouth daily with breakfast.    . naproxen (NAPROSYN) 500 MG tablet Take 1 tablet (500 mg total) by mouth 2 (two) times daily with a meal. 60 tablet 5  . nebivolol (BYSTOLIC) 10 MG tablet Take 10 mg by mouth daily.    Jabier Gauss 40-10-25 MG TABS Take 1 tablet by mouth daily.  1   No current facility-administered medications for this visit.      Physical Exam  Blood pressure (!) 131/91, pulse 76, height 6' (1.829 m), weight 191 lb (86.6 kg).  Constitutional: overall normal hygiene, normal nutrition, well developed, normal grooming, normal body habitus. Assistive device:none  Musculoskeletal: gait and station Limp left, muscle tone and strength are normal, no tremors or atrophy is present.  .  Neurological: coordination overall normal.  Deep tendon reflex/nerve stretch intact.  Sensation normal.  Cranial nerves II-XII intact.   Skin:   Normal overall no scars, lesions, ulcers or rashes. No psoriasis.  Psychiatric: Alert and oriented x 3.  Recent memory intact, remote memory unclear.  Normal mood and affect. Well groomed.  Good eye contact.  Cardiovascular: overall no swelling, no varicosities, no edema bilaterally, normal temperatures of the legs and arms, no clubbing, cyanosis and good capillary refill.  Lymphatic: palpation is normal.  Examination of right Upper Extremity is done.  Inspection:   Overall:  Elbow non-tender without crepitus or defects, forearm non-tender without crepitus or defects, wrist non-tender without crepitus or defects, hand non-tender.    Shoulder: with glenohumeral joint tenderness, without effusion.   Upper arm: without swelling and tenderness   Range of motion:   Overall:  Full range of motion of the elbow, full range of motion of wrist and full range of motion in fingers.   Shoulder:  right  165  degrees forward flexion; 150 degrees abduction; 35 degrees internal rotation, 35 degrees external rotation, 15 degrees extension, 40 degrees adduction.   Stability:   Overall:  Shoulder, elbow and wrist stable   Strength and Tone:   Overall full shoulder muscles strength, full upper arm strength and normal upper arm bulk and tone.  The left lower extremity is examined:  Inspection:  Thigh:  Non-tender and no defects  Knee has swelling 1/2+ effusion.                        Joint tenderness is present                        Patient is tender over the medial joint line  Lower Leg:  Has normal appearance and no tenderness or defects  Ankle:  Non-tender and no defects  Foot:  Non-tender and no defects Range of Motion:  Knee:  Range of motion is: 0-110                        Crepitus is  present  Ankle:  Range of motion is normal. Strength and Tone:  The left lower extremity has normal strength and tone. Stability:  Knee:  The knee is stable.  Ankle:  The ankle is stable.    All other systems reviewed and are negative   The patient has been educated about the nature of the problem(s) and counseled on treatment options.  The patient appeared to understand what I have discussed and is in agreement with it.  Encounter Diagnoses  Name Primary?  . Chronic pain of left knee Yes  . Chronic pain in right shoulder     PROCEDURE NOTE:  The patient requests injections of the left knee , verbal consent was obtained.  The left knee was prepped appropriately after time out was performed.   Sterile technique was observed and injection of 1 cc of Depo-Medrol 40 mg with several cc's of plain xylocaine. Anesthesia was provided by ethyl chloride and a 20-gauge needle was used to inject the knee area. The injection was tolerated well.  A band aid dressing was applied.  The patient was advised to apply ice later today and tomorrow to the injection sight as needed.  PROCEDURE NOTE:  The  patient request injection, verbal consent was obtained.  The right shoulder was prepped appropriately after time out was performed.   Sterile technique was observed and injection of 1 cc of Depo-Medrol 40 mg with several cc's of plain xylocaine. Anesthesia was provided by ethyl chloride and a 20-gauge needle was used to inject the shoulder area. A posterior approach was used.  The injection was tolerated well.  A band aid dressing was applied.  The patient was advised to apply ice later today and tomorrow to the injection sight as needed.   PLAN Call if any problems.  Precautions discussed.  Continue current  medications.   Return to clinic 2 weeks   Electronically Signed Sanjuana Kava, MD 9/3/20193:28 PM

## 2017-09-17 MED FILL — TRIAMCINOLONE 0.1% CREAM: 0.1 | 30 days supply | Qty: 45 | Fill #0

## 2017-09-17 MED FILL — BYSTOLIC 20 MG TABLET: 20 | 30 days supply | Qty: 30 | Fill #0

## 2017-09-17 MED FILL — ALLOPURINOL 100 MG TABLET: 100 | 30 days supply | Qty: 30 | Fill #0

## 2017-09-30 ENCOUNTER — Encounter: Payer: Self-pay | Admitting: Orthopaedic Surgery

## 2017-09-30 ENCOUNTER — Ambulatory Visit: Payer: 59 | Admitting: Orthopaedic Surgery

## 2017-10-30 MED FILL — OLMSRTN-AMLDPN-HCTZ 40-10-2: 40-10-25 | 30 days supply | Qty: 30 | Fill #1

## 2017-10-30 MED FILL — BYSTOLIC 20 MG TABLET: 20 | 90 days supply | Qty: 90 | Fill #1

## 2017-11-07 IMAGING — CT CT HEAD W/O CM
3 of 4 series · 15 of 47 positions shown, 18 images · non-contrast
Comparison: None.

CLINICAL DATA: Dizziness starting yesterday. Hypertension.
Diabetes.

EXAM:
CT HEAD WITHOUT CONTRAST
TECHNIQUE: Contiguous axial images were obtained from the base of the skull
through the vertex without intravenous contrast.

[Series 3: head w/o · axial · non-contrast · 0.42mm/px · z∈[+98,+234]mm · 9 of 40 slices shown, 12 images]
[im 3/40  brain]
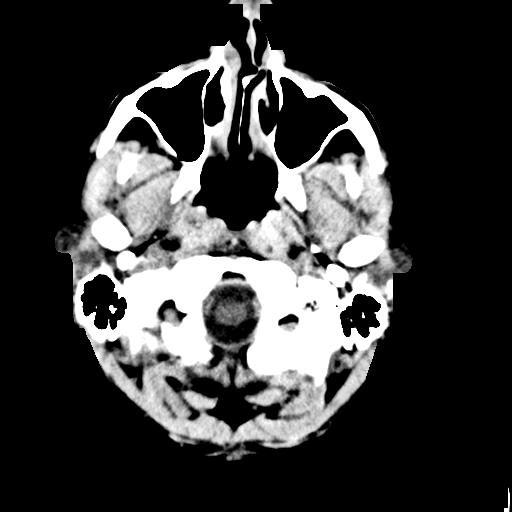
[im 3/40  bone]
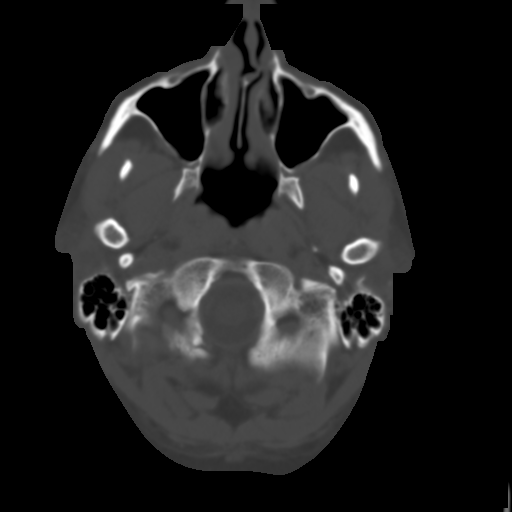
[im 9/40  brain]
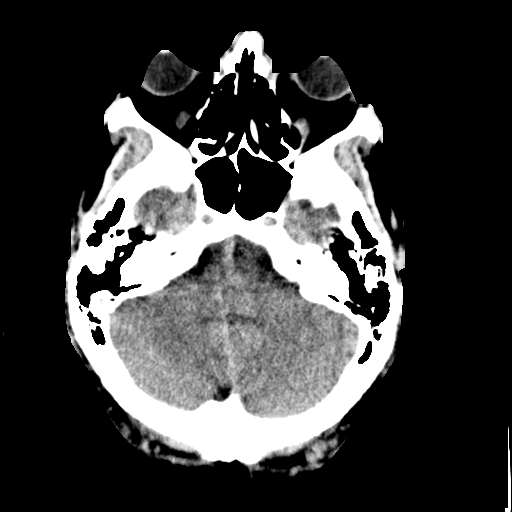
[im 12/40  brain]
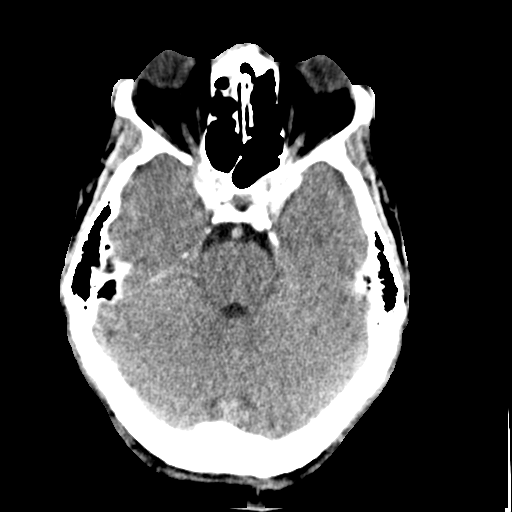
[im 17/40  brain]
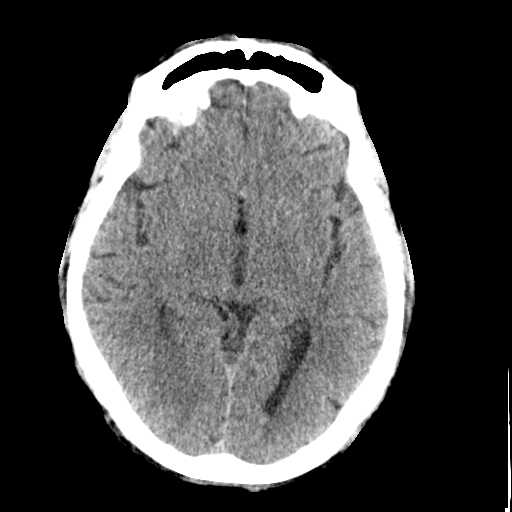
[im 20/40  brain]
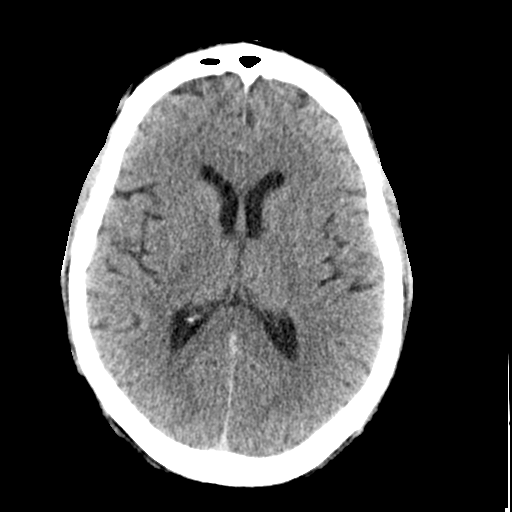
[im 20/40  bone]
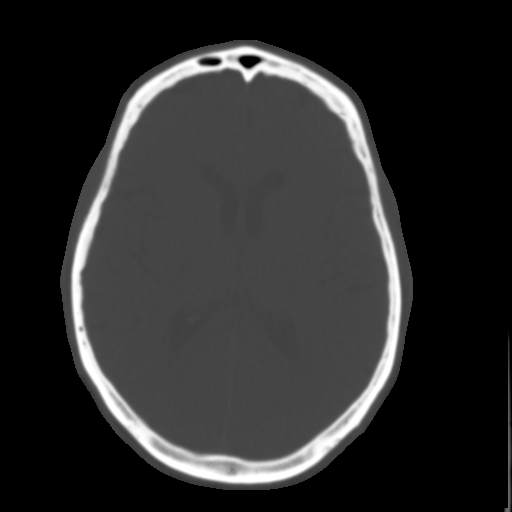
[im 23/40  brain]
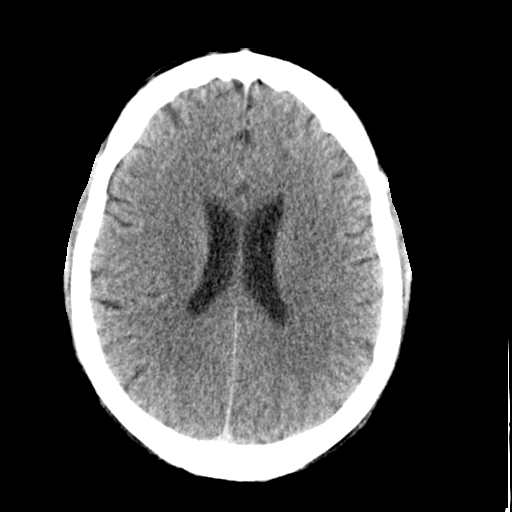
[im 28/40  brain]
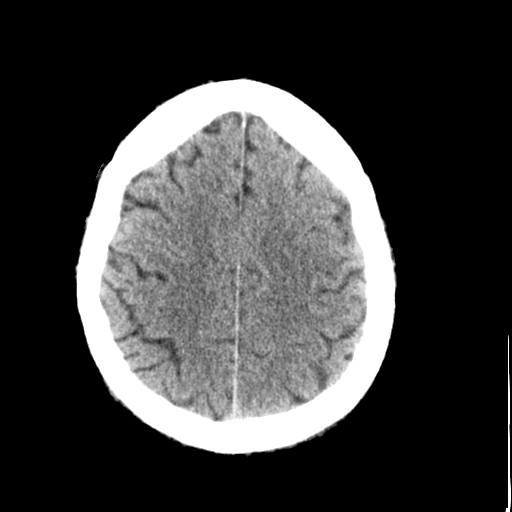
[im 31/40  brain]
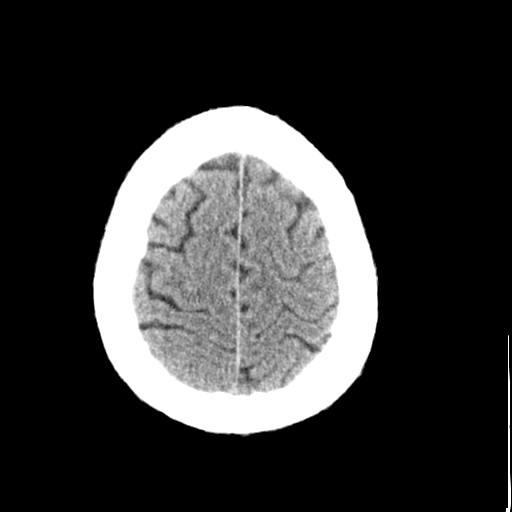
[im 37/40  brain]
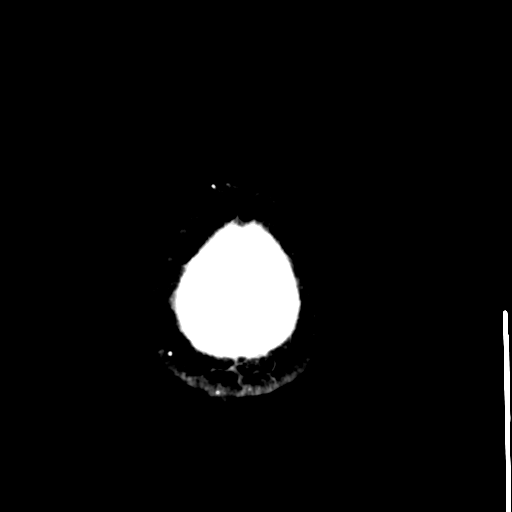
[im 37/40  bone]
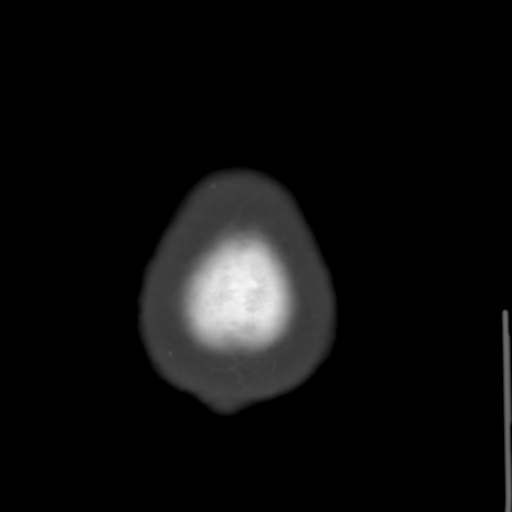

[Series 5: coronal · coronal · 0.30mm/px · 3 of 70 slices shown]
[im 24/70  brain]
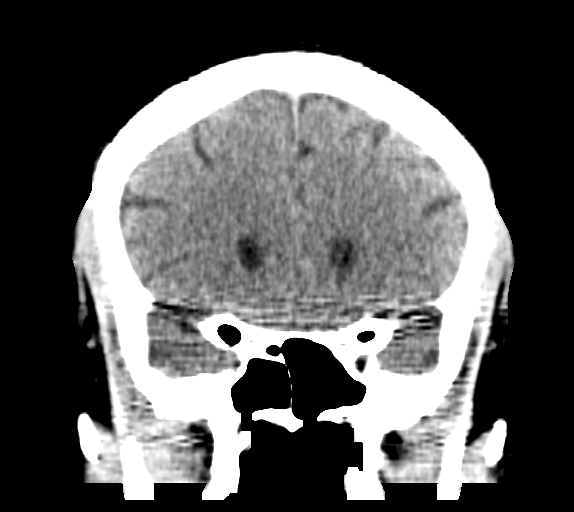
[im 31/70  brain]
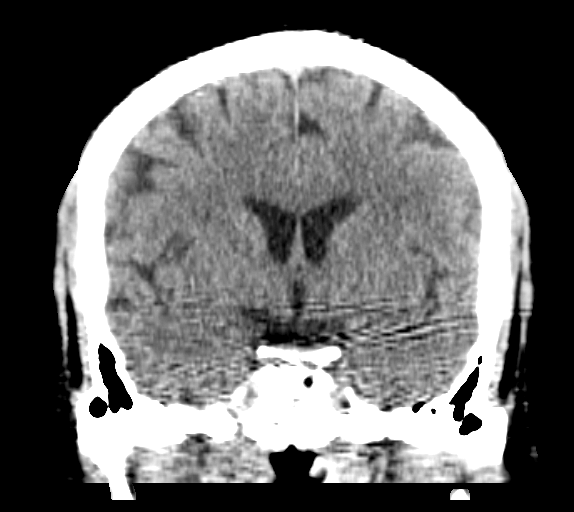
[im 39/70  brain]
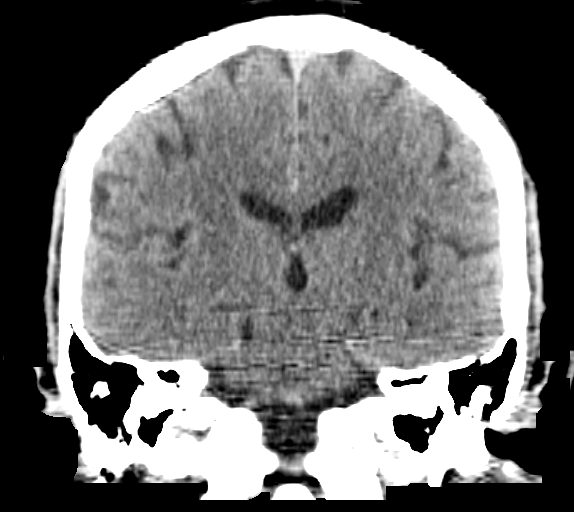

[Series 6: sagittal · sagittal · 0.32mm/px · 3 of 58 slices shown]
[im 20/58  brain]
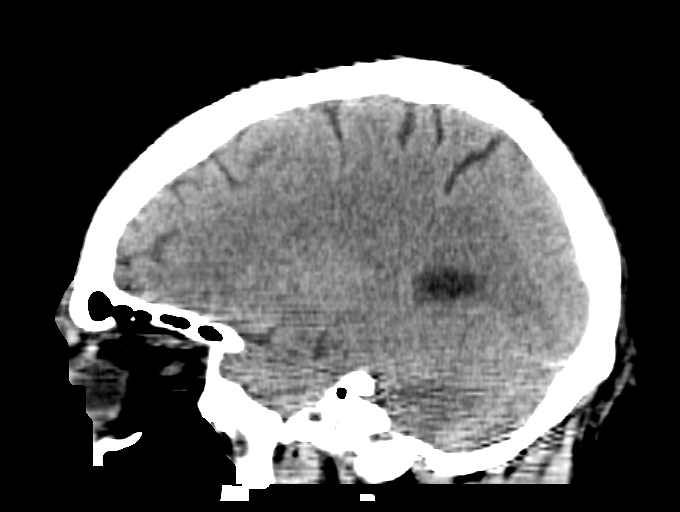
[im 29/58  brain]
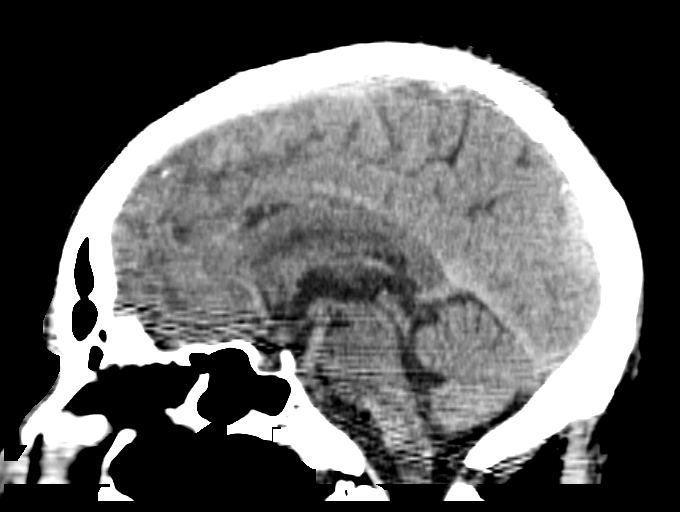
[im 39/58  brain]
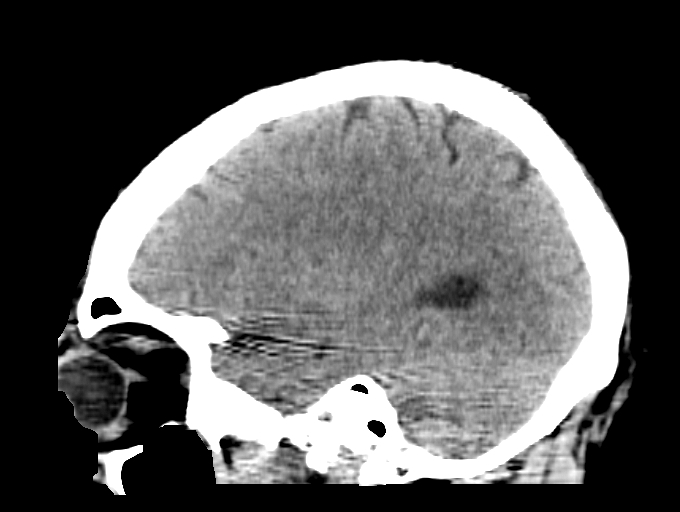

[15 of 47 positions shown; findings below may reference images not displayed]

FINDINGS: The brainstem, cerebellum, cerebral peduncles, thalami, basal
ganglia, basilar cisterns, and ventricular system appear within
normal limits. No intracranial hemorrhage, mass lesion, or acute
CVA. Degenerative spurring at the anterior C1-2 articulation.
IMPRESSION: 1. No significant intracranial abnormality to explain the patient's
current dizziness.

## 2017-12-15 MED FILL — metFORMIN HCL 500 MG TABS: 500 | 90 days supply | Qty: 180 | Fill #2

## 2017-12-15 MED FILL — OLMSRTN-AMLDPN-HCTZ 40-10-2: 40-10-25 | 30 days supply | Qty: 30 | Fill #0

## 2018-01-20 MED FILL — OLMSRTN-AMLDPN-HCTZ 40-10-2: 40-10-25 | 30 days supply | Qty: 30 | Fill #1

## 2018-01-20 MED FILL — BYSTOLIC 20 MG TABLET: 20 | 30 days supply | Qty: 30 | Fill #0

## 2018-01-21 DIAGNOSIS — I10 Essential (primary) hypertension: Secondary | ICD-10-CM | POA: Diagnosis not present

## 2018-01-21 DIAGNOSIS — H6123 Impacted cerumen, bilateral: Secondary | ICD-10-CM | POA: Diagnosis not present

## 2018-01-21 DIAGNOSIS — E118 Type 2 diabetes mellitus with unspecified complications: Secondary | ICD-10-CM | POA: Diagnosis not present

## 2018-01-21 DIAGNOSIS — I129 Hypertensive chronic kidney disease with stage 1 through stage 4 chronic kidney disease, or unspecified chronic kidney disease: Secondary | ICD-10-CM | POA: Diagnosis not present

## 2018-01-21 DIAGNOSIS — Z6827 Body mass index (BMI) 27.0-27.9, adult: Secondary | ICD-10-CM | POA: Diagnosis not present

## 2018-01-28 ENCOUNTER — Ambulatory Visit: Payer: 59 | Admitting: Orthopaedic Surgery

## 2018-01-28 ENCOUNTER — Encounter: Payer: Self-pay | Admitting: Orthopaedic Surgery

## 2018-01-28 VITALS — BP 141/97 | HR 71 | Ht 72.0 in | Wt 197.0 lb

## 2018-01-28 DIAGNOSIS — M25511 Pain in right shoulder: Secondary | ICD-10-CM | POA: Diagnosis not present

## 2018-01-28 DIAGNOSIS — G8929 Other chronic pain: Secondary | ICD-10-CM

## 2018-01-28 NOTE — Progress Notes (Signed)
PROCEDURE NOTE:  The patient request injection, verbal consent was obtained.  The right shoulder was prepped appropriately after time out was performed.   Sterile technique was observed and injection of 1 cc of Depo-Medrol 40 mg with several cc's of plain xylocaine. Anesthesia was provided by ethyl chloride and a 20-gauge needle was used to inject the shoulder area. A posterior approach was used.  The injection was tolerated well.  A band aid dressing was applied.  The patient was advised to apply ice later today and tomorrow to the injection sight as needed.  I will see as needed.  Electronically Signed Sanjuana Kava, MD 1/15/202010:01 AM

## 2018-02-05 DIAGNOSIS — R110 Nausea: Secondary | ICD-10-CM | POA: Diagnosis not present

## 2018-02-05 DIAGNOSIS — R112 Nausea with vomiting, unspecified: Secondary | ICD-10-CM | POA: Diagnosis not present

## 2018-02-05 DIAGNOSIS — I10 Essential (primary) hypertension: Secondary | ICD-10-CM | POA: Diagnosis not present

## 2018-02-05 DIAGNOSIS — R42 Dizziness and giddiness: Secondary | ICD-10-CM | POA: Diagnosis not present

## 2018-02-05 DIAGNOSIS — E119 Type 2 diabetes mellitus without complications: Secondary | ICD-10-CM | POA: Diagnosis not present

## 2018-02-05 DIAGNOSIS — R51 Headache: Secondary | ICD-10-CM | POA: Diagnosis not present

## 2018-02-06 ENCOUNTER — Emergency Department (HOSPITAL_COMMUNITY): Payer: 59

## 2018-02-06 ENCOUNTER — Inpatient Hospital Stay (HOSPITAL_COMMUNITY)
Admission: EM | Admit: 2018-02-06 | Discharge: 2018-02-14 | DRG: 023 | Disposition: E | Payer: 59 | Attending: Neurology | Admitting: Neurology

## 2018-02-06 ENCOUNTER — Inpatient Hospital Stay (HOSPITAL_COMMUNITY): Payer: 59

## 2018-02-06 ENCOUNTER — Encounter (HOSPITAL_COMMUNITY): Payer: Self-pay | Admitting: *Deleted

## 2018-02-06 ENCOUNTER — Other Ambulatory Visit: Payer: Self-pay

## 2018-02-06 DIAGNOSIS — J90 Pleural effusion, not elsewhere classified: Secondary | ICD-10-CM | POA: Diagnosis not present

## 2018-02-06 DIAGNOSIS — G936 Cerebral edema: Secondary | ICD-10-CM | POA: Diagnosis not present

## 2018-02-06 DIAGNOSIS — Z515 Encounter for palliative care: Secondary | ICD-10-CM

## 2018-02-06 DIAGNOSIS — R509 Fever, unspecified: Secondary | ICD-10-CM | POA: Diagnosis not present

## 2018-02-06 DIAGNOSIS — R29810 Facial weakness: Secondary | ICD-10-CM | POA: Diagnosis present

## 2018-02-06 DIAGNOSIS — R402222 Coma scale, best verbal response, incomprehensible words, at arrival to emergency department: Secondary | ICD-10-CM | POA: Diagnosis present

## 2018-02-06 DIAGNOSIS — E1165 Type 2 diabetes mellitus with hyperglycemia: Secondary | ICD-10-CM | POA: Diagnosis present

## 2018-02-06 DIAGNOSIS — G932 Benign intracranial hypertension: Secondary | ICD-10-CM | POA: Diagnosis not present

## 2018-02-06 DIAGNOSIS — M109 Gout, unspecified: Secondary | ICD-10-CM | POA: Diagnosis present

## 2018-02-06 DIAGNOSIS — R402112 Coma scale, eyes open, never, at arrival to emergency department: Secondary | ICD-10-CM | POA: Diagnosis present

## 2018-02-06 DIAGNOSIS — Z4682 Encounter for fitting and adjustment of non-vascular catheter: Secondary | ICD-10-CM | POA: Diagnosis not present

## 2018-02-06 DIAGNOSIS — Z79899 Other long term (current) drug therapy: Secondary | ICD-10-CM

## 2018-02-06 DIAGNOSIS — N183 Chronic kidney disease, stage 3 (moderate): Secondary | ICD-10-CM | POA: Diagnosis present

## 2018-02-06 DIAGNOSIS — I639 Cerebral infarction, unspecified: Principal | ICD-10-CM | POA: Diagnosis present

## 2018-02-06 DIAGNOSIS — I129 Hypertensive chronic kidney disease with stage 1 through stage 4 chronic kidney disease, or unspecified chronic kidney disease: Secondary | ICD-10-CM | POA: Diagnosis present

## 2018-02-06 DIAGNOSIS — G919 Hydrocephalus, unspecified: Secondary | ICD-10-CM | POA: Diagnosis present

## 2018-02-06 DIAGNOSIS — Z452 Encounter for adjustment and management of vascular access device: Secondary | ICD-10-CM

## 2018-02-06 DIAGNOSIS — Z7189 Other specified counseling: Secondary | ICD-10-CM | POA: Diagnosis not present

## 2018-02-06 DIAGNOSIS — E119 Type 2 diabetes mellitus without complications: Secondary | ICD-10-CM | POA: Diagnosis not present

## 2018-02-06 DIAGNOSIS — E1122 Type 2 diabetes mellitus with diabetic chronic kidney disease: Secondary | ICD-10-CM | POA: Diagnosis present

## 2018-02-06 DIAGNOSIS — N179 Acute kidney failure, unspecified: Secondary | ICD-10-CM | POA: Diagnosis present

## 2018-02-06 DIAGNOSIS — R0902 Hypoxemia: Secondary | ICD-10-CM | POA: Diagnosis not present

## 2018-02-06 DIAGNOSIS — R4182 Altered mental status, unspecified: Secondary | ICD-10-CM | POA: Diagnosis not present

## 2018-02-06 DIAGNOSIS — J9690 Respiratory failure, unspecified, unspecified whether with hypoxia or hypercapnia: Secondary | ICD-10-CM | POA: Diagnosis not present

## 2018-02-06 DIAGNOSIS — E785 Hyperlipidemia, unspecified: Secondary | ICD-10-CM | POA: Diagnosis not present

## 2018-02-06 DIAGNOSIS — D72829 Elevated white blood cell count, unspecified: Secondary | ICD-10-CM | POA: Diagnosis not present

## 2018-02-06 DIAGNOSIS — Z794 Long term (current) use of insulin: Secondary | ICD-10-CM | POA: Diagnosis not present

## 2018-02-06 DIAGNOSIS — J9601 Acute respiratory failure with hypoxia: Secondary | ICD-10-CM

## 2018-02-06 DIAGNOSIS — Z66 Do not resuscitate: Secondary | ICD-10-CM | POA: Diagnosis present

## 2018-02-06 DIAGNOSIS — G911 Obstructive hydrocephalus: Secondary | ICD-10-CM | POA: Diagnosis present

## 2018-02-06 DIAGNOSIS — Z9911 Dependence on respirator [ventilator] status: Secondary | ICD-10-CM

## 2018-02-06 DIAGNOSIS — R4020 Unspecified coma: Secondary | ICD-10-CM | POA: Diagnosis not present

## 2018-02-06 DIAGNOSIS — J14 Pneumonia due to Hemophilus influenzae: Secondary | ICD-10-CM | POA: Diagnosis not present

## 2018-02-06 DIAGNOSIS — R402342 Coma scale, best motor response, flexion withdrawal, at arrival to emergency department: Secondary | ICD-10-CM | POA: Diagnosis present

## 2018-02-06 DIAGNOSIS — R739 Hyperglycemia, unspecified: Secondary | ICD-10-CM | POA: Diagnosis not present

## 2018-02-06 DIAGNOSIS — I6389 Other cerebral infarction: Secondary | ICD-10-CM | POA: Diagnosis not present

## 2018-02-06 DIAGNOSIS — Z7984 Long term (current) use of oral hypoglycemic drugs: Secondary | ICD-10-CM | POA: Diagnosis not present

## 2018-02-06 DIAGNOSIS — I10 Essential (primary) hypertension: Secondary | ICD-10-CM

## 2018-02-06 DIAGNOSIS — Z9289 Personal history of other medical treatment: Secondary | ICD-10-CM

## 2018-02-06 DIAGNOSIS — E1159 Type 2 diabetes mellitus with other circulatory complications: Secondary | ICD-10-CM | POA: Diagnosis not present

## 2018-02-06 DIAGNOSIS — G935 Compression of brain: Secondary | ICD-10-CM | POA: Diagnosis not present

## 2018-02-06 DIAGNOSIS — R918 Other nonspecific abnormal finding of lung field: Secondary | ICD-10-CM | POA: Diagnosis not present

## 2018-02-06 DIAGNOSIS — G918 Other hydrocephalus: Secondary | ICD-10-CM | POA: Diagnosis not present

## 2018-02-06 DIAGNOSIS — I63549 Cerebral infarction due to unspecified occlusion or stenosis of unspecified cerebellar artery: Secondary | ICD-10-CM | POA: Diagnosis not present

## 2018-02-06 DIAGNOSIS — R404 Transient alteration of awareness: Secondary | ICD-10-CM | POA: Diagnosis not present

## 2018-02-06 LAB — URINALYSIS, ROUTINE W REFLEX MICROSCOPIC
Bacteria, UA: NONE SEEN
Bilirubin Urine: NEGATIVE
Glucose, UA: >=500 mg/dL — AB
KETONES UR: 20 mg/dL — AB
Leukocytes, UA: NEGATIVE
Nitrite: NEGATIVE
Protein, ur: 100 mg/dL — AB
Specific Gravity, Urine: 1.022 (ref 1.005–1.030)
pH: 6 (ref 5.0–8.0)

## 2018-02-06 LAB — CBC
HCT: 47.6 % (ref 39.0–52.0)
Hemoglobin: 15 g/dL (ref 13.0–17.0)
MCH: 28.1 pg (ref 26.0–34.0)
MCHC: 31.5 g/dL (ref 30.0–36.0)
MCV: 89.1 fL (ref 80.0–100.0)
Platelets: 249 10*3/uL (ref 150–400)
RBC: 5.34 MIL/uL (ref 4.22–5.81)
RDW: 12.8 % (ref 11.5–15.5)
WBC: 17 10*3/uL — ABNORMAL HIGH (ref 4.0–10.5)
nRBC: 0 % (ref 0.0–0.2)

## 2018-02-06 LAB — DIFFERENTIAL
Abs Immature Granulocytes: 0.08 10*3/uL — ABNORMAL HIGH (ref 0.00–0.07)
Basophils Absolute: 0 10*3/uL (ref 0.0–0.1)
Basophils Relative: 0 %
Eosinophils Absolute: 0 10*3/uL (ref 0.0–0.5)
Eosinophils Relative: 0 %
Immature Granulocytes: 1 %
Lymphocytes Relative: 5 %
Lymphs Abs: 0.8 10*3/uL (ref 0.7–4.0)
Monocytes Absolute: 1.3 10*3/uL — ABNORMAL HIGH (ref 0.1–1.0)
Monocytes Relative: 8 %
Neutro Abs: 14.8 10*3/uL — ABNORMAL HIGH (ref 1.7–7.7)
Neutrophils Relative %: 86 %

## 2018-02-06 LAB — POCT I-STAT 7, (LYTES, BLD GAS, ICA,H+H)
Acid-Base Excess: 6 mmol/L — ABNORMAL HIGH (ref 0.0–2.0)
Bicarbonate: 29.1 mmol/L — ABNORMAL HIGH (ref 20.0–28.0)
Calcium, Ion: 1.16 mmol/L (ref 1.15–1.40)
HCT: 42 % (ref 39.0–52.0)
Hemoglobin: 14.3 g/dL (ref 13.0–17.0)
O2 SAT: 100 %
PCO2 ART: 35.3 mmHg (ref 32.0–48.0)
Potassium: 3.6 mmol/L (ref 3.5–5.1)
Sodium: 139 mmol/L (ref 135–145)
TCO2: 30 mmol/L (ref 22–32)
pH, Arterial: 7.523 — ABNORMAL HIGH (ref 7.350–7.450)
pO2, Arterial: 464 mmHg — ABNORMAL HIGH (ref 83.0–108.0)

## 2018-02-06 LAB — PHOSPHORUS
Phosphorus: 2.6 mg/dL (ref 2.5–4.6)
Phosphorus: 2.5 mg/dL (ref 2.5–4.6)

## 2018-02-06 LAB — COMPREHENSIVE METABOLIC PANEL
ALBUMIN: 3.5 g/dL (ref 3.5–5.0)
ALT: 14 U/L (ref 0–44)
AST: 40 U/L (ref 15–41)
Alkaline Phosphatase: 56 U/L (ref 38–126)
Anion gap: 12 (ref 5–15)
BILIRUBIN TOTAL: 1.1 mg/dL (ref 0.3–1.2)
BUN: 19 mg/dL (ref 8–23)
CO2: 25 mmol/L (ref 22–32)
Calcium: 9 mg/dL (ref 8.9–10.3)
Chloride: 103 mmol/L (ref 98–111)
Creatinine, Ser: 1.5 mg/dL — ABNORMAL HIGH (ref 0.61–1.24)
GFR calc Af Amer: 57 mL/min — ABNORMAL LOW (ref >60)
GFR calc non Af Amer: 49 mL/min — ABNORMAL LOW (ref >60)
GLUCOSE: 183 mg/dL — AB (ref 70–99)
POTASSIUM: 4.2 mmol/L (ref 3.5–5.1)
Sodium: 140 mmol/L (ref 135–145)
Total Protein: 7.3 g/dL (ref 6.5–8.1)

## 2018-02-06 LAB — RAPID URINE DRUG SCREEN, HOSP PERFORMED
Amphetamines: NOT DETECTED
Barbiturates: NOT DETECTED
Benzodiazepines: POSITIVE — AB
Cocaine: NOT DETECTED
Opiates: NOT DETECTED
Tetrahydrocannabinol: NOT DETECTED

## 2018-02-06 LAB — APTT: aPTT: 24 seconds (ref 24–36)

## 2018-02-06 LAB — BLOOD GAS, ARTERIAL
Acid-Base Excess: 1.8 mmol/L (ref 0.0–2.0)
Bicarbonate: 25.9 mmol/L (ref 20.0–28.0)
Drawn by: 22407
FIO2: 0.5
MECHVT: 600 mL
O2 Saturation: 96.6 %
PEEP: 5 cmH2O
Patient temperature: 98.6
RATE: 10 resp/min
pCO2 arterial: 40.9 mmHg (ref 32.0–48.0)
pH, Arterial: 7.417 (ref 7.350–7.450)
pO2, Arterial: 91.3 mmHg (ref 83.0–108.0)

## 2018-02-06 LAB — SODIUM
Sodium: 140 mmol/L (ref 135–145)
Sodium: 139 mmol/L (ref 135–145)
Sodium: 140 mmol/L (ref 135–145)

## 2018-02-06 LAB — GLUCOSE, CAPILLARY
GLUCOSE-CAPILLARY: 178 mg/dL — AB (ref 70–99)
Glucose-Capillary: 262 mg/dL — ABNORMAL HIGH (ref 70–99)
Glucose-Capillary: 220 mg/dL — ABNORMAL HIGH (ref 70–99)
Glucose-Capillary: 205 mg/dL — ABNORMAL HIGH (ref 70–99)
Glucose-Capillary: 199 mg/dL — ABNORMAL HIGH (ref 70–99)
Glucose-Capillary: 230 mg/dL — ABNORMAL HIGH (ref 70–99)
Glucose-Capillary: 142 mg/dL — ABNORMAL HIGH (ref 70–99)
Glucose-Capillary: 85 mg/dL (ref 70–99)

## 2018-02-06 LAB — POCT I-STAT EG7
Acid-Base Excess: 5 mmol/L — ABNORMAL HIGH (ref 0.0–2.0)
BICARBONATE: 27.4 mmol/L (ref 20.0–28.0)
Calcium, Ion: 0.91 mmol/L — ABNORMAL LOW (ref 1.15–1.40)
HEMATOCRIT: 46 % (ref 39.0–52.0)
Hemoglobin: 15.6 g/dL (ref 13.0–17.0)
O2 Saturation: 100 %
Potassium: 6.8 mmol/L (ref 3.5–5.1)
Sodium: 135 mmol/L (ref 135–145)
TCO2: 28 mmol/L (ref 22–32)
pCO2, Ven: 33.6 mmHg — ABNORMAL LOW (ref 44.0–60.0)
pH, Ven: 7.52 — ABNORMAL HIGH (ref 7.250–7.430)
pO2, Ven: 221 mmHg — ABNORMAL HIGH (ref 32.0–45.0)

## 2018-02-06 LAB — TRIGLYCERIDES
TRIGLYCERIDES: 70 mg/dL (ref <150)
Triglycerides: 93 mg/dL (ref <150)

## 2018-02-06 LAB — MRSA PCR SCREENING: MRSA by PCR: NEGATIVE

## 2018-02-06 LAB — I-STAT TROPONIN, ED: Troponin i, poc: 0.02 ng/mL (ref 0.00–0.08)

## 2018-02-06 LAB — MAGNESIUM
MAGNESIUM: 1.9 mg/dL (ref 1.7–2.4)
Magnesium: 2.1 mg/dL (ref 1.7–2.4)

## 2018-02-06 LAB — ETHANOL

## 2018-02-06 LAB — PROTIME-INR
INR: 1.15
Prothrombin Time: 14.6 seconds (ref 11.4–15.2)

## 2018-02-06 LAB — CBG MONITORING, ED: Glucose-Capillary: 157 mg/dL — ABNORMAL HIGH (ref 70–99)

## 2018-02-06 LAB — I-STAT CREATININE, ED: Creatinine, Ser: 1.4 mg/dL — ABNORMAL HIGH (ref 0.61–1.24)

## 2018-02-06 MED ORDER — PROPOFOL 1000 MG/100ML IV EMUL
0.0000 ug/kg/min | INTRAVENOUS | Status: DC
  Administered 2018-02-06 (×2): 20 ug/kg/min via INTRAVENOUS

## 2018-02-06 MED ORDER — ACETAMINOPHEN 160 MG/5ML PO SOLN
650.0000 mg | Freq: Four times a day (QID) | ORAL | Status: DC | PRN
  Administered 2018-02-06 – 2018-02-08 (×5): 650 mg
  Filled 2018-02-06 (×5): qty 20.3

## 2018-02-06 MED ORDER — IOPAMIDOL (ISOVUE-370) INJECTION 76%
INTRAVENOUS | Status: AC
  Filled 2018-02-06: qty 100

## 2018-02-06 MED ORDER — CHLORHEXIDINE GLUCONATE 0.12% ORAL RINSE (MEDLINE KIT)
15.0000 mL | Freq: Two times a day (BID) | OROMUCOSAL | Status: DC
  Administered 2018-02-06 – 2018-02-09 (×7): 15 mL via OROMUCOSAL

## 2018-02-06 MED ORDER — FENTANYL CITRATE (PF) 100 MCG/2ML IJ SOLN: 50.0000 ug | Freq: Once | INTRAMUSCULAR | Status: DC

## 2018-02-06 MED ORDER — ETOMIDATE 2 MG/ML IV SOLN
INTRAVENOUS | Status: AC | PRN
  Administered 2018-02-06: 30 mg via INTRAVENOUS

## 2018-02-06 MED ORDER — INSULIN ASPART 100 UNIT/ML ~~LOC~~ SOLN
2.0000 [IU] | SUBCUTANEOUS | Status: DC
  Administered 2018-02-06: 4 [IU] via SUBCUTANEOUS

## 2018-02-06 MED ORDER — FENTANYL CITRATE (PF) 100 MCG/2ML IJ SOLN
INTRAMUSCULAR | Status: AC
  Administered 2018-02-06: 11:00:00
  Filled 2018-02-06: qty 2

## 2018-02-06 MED ORDER — PRO-STAT SUGAR FREE PO LIQD
30.0000 mL | Freq: Every day | ORAL | Status: DC
  Administered 2018-02-06 – 2018-02-09 (×4): 30 mL
  Filled 2018-02-06 (×4): qty 30

## 2018-02-06 MED ORDER — LABETALOL HCL 5 MG/ML IV SOLN
10.0000 mg | INTRAVENOUS | Status: DC | PRN
  Administered 2018-02-07 – 2018-02-08 (×4): 10 mg via INTRAVENOUS
  Filled 2018-02-06 (×4): qty 4

## 2018-02-06 MED ORDER — PROPOFOL 1000 MG/100ML IV EMUL
INTRAVENOUS | Status: AC
  Filled 2018-02-06: qty 100

## 2018-02-06 MED ORDER — IOPAMIDOL (ISOVUE-370) INJECTION 76%
100.0000 mL | Freq: Once | INTRAVENOUS | Status: AC | PRN
  Administered 2018-02-06: 100 mL via INTRAVENOUS

## 2018-02-06 MED ORDER — INSULIN REGULAR(HUMAN) IN NACL 100-0.9 UT/100ML-% IV SOLN
INTRAVENOUS | Status: DC
  Administered 2018-02-06: 2 [IU]/h via INTRAVENOUS
  Filled 2018-02-06: qty 100

## 2018-02-06 MED ORDER — ORAL CARE MOUTH RINSE
15.0000 mL | OROMUCOSAL | Status: DC
  Administered 2018-02-06 – 2018-02-09 (×28): 15 mL via OROMUCOSAL

## 2018-02-06 MED ORDER — CLEVIDIPINE BUTYRATE 0.5 MG/ML IV EMUL
0.0000 mg/h | INTRAVENOUS | Status: DC
  Administered 2018-02-06: 20 mg/h via INTRAVENOUS
  Administered 2018-02-06: 21 mg/h via INTRAVENOUS
  Administered 2018-02-06: 19 mg/h via INTRAVENOUS
  Administered 2018-02-06: 1 mg/h via INTRAVENOUS
  Administered 2018-02-06: 20 mg/h via INTRAVENOUS
  Administered 2018-02-06 (×2): 21 mg/h via INTRAVENOUS
  Administered 2018-02-06: 18 mg/h via INTRAVENOUS
  Filled 2018-02-06 (×7): qty 50

## 2018-02-06 MED ORDER — SUCCINYLCHOLINE CHLORIDE 20 MG/ML IJ SOLN
INTRAMUSCULAR | Status: AC | PRN
  Administered 2018-02-06: 100 mg via INTRAVENOUS

## 2018-02-06 MED ORDER — FENTANYL BOLUS VIA INFUSION
50.0000 ug | INTRAVENOUS | Status: DC | PRN
  Filled 2018-02-06: qty 50

## 2018-02-06 MED ORDER — SODIUM CHLORIDE 3 % IV SOLN
INTRAVENOUS | Status: AC
  Administered 2018-02-06: 50 mL/h via INTRAVENOUS
  Administered 2018-02-07: 75 mL/h via INTRAVENOUS
  Filled 2018-02-06 (×3): qty 500

## 2018-02-06 MED ORDER — NALOXONE HCL 0.4 MG/ML IJ SOLN
INTRAMUSCULAR | Status: AC
  Administered 2018-02-06: 0.4 mg
  Filled 2018-02-06: qty 1

## 2018-02-06 MED ORDER — VITAL AF 1.2 CAL PO LIQD
1000.0000 mL | ORAL | Status: DC
  Administered 2018-02-06 – 2018-02-07 (×3): 1000 mL

## 2018-02-06 MED ORDER — PROPOFOL 1000 MG/100ML IV EMUL
0.0000 ug/kg/min | INTRAVENOUS | Status: DC
  Administered 2018-02-06: 5 ug/kg/min via INTRAVENOUS
  Filled 2018-02-06: qty 100

## 2018-02-06 MED ORDER — FENTANYL 2500MCG IN NS 250ML (10MCG/ML) PREMIX INFUSION: 25.0000 ug/h | INTRAVENOUS | Status: DC

## 2018-02-06 MED ORDER — NALOXONE HCL 2 MG/2ML IJ SOSY
PREFILLED_SYRINGE | INTRAMUSCULAR | Status: AC
  Filled 2018-02-06: qty 2

## 2018-02-06 MED ORDER — CLEVIDIPINE 50 MG/100ML IV EMUL
0.0000 mg/h | INTRAVENOUS | Status: DC
  Filled 2018-02-06: qty 100

## 2018-02-06 NOTE — ED Triage Notes (Signed)
Patient presents to ed via [facility] , states patient was c/o n/v and dizziness on wed was seen at [facility] for vertigo, and d/c'd home, per ems wife heard patient trying to get out of bed at 2am ,  However ems states patient was unresp. Would withdraw from deep painful stimuli by withdrawing with left hand.  0800 Dr. Maryan Rued at bedside  To intubate.

## 2018-02-06 NOTE — Progress Notes (Signed)
RT NOTE:  Received in 4N28 pt from ER on vent.

## 2018-02-06 NOTE — Procedures (Signed)
Intubation Procedure Note Jared Vazquez [id number] 09/18/1955  Procedure: Intubation Indications: Airway protection and maintenance  Procedure Details Consent: Unable to obtain consent because of altered level of consciousness. Time Out: Verified patient identification, verified procedure, site/side was marked, verified correct patient position, special equipment/implants available, medications/allergies/relevent history reviewed, required imaging and test results available.  Performed  Maximum sterile technique was used including gloves, gown, mask and sheet.  MAC and 4    Evaluation Hemodynamic Status: BP stable throughout; O2 sats: stable throughout Patient's Current Condition: stable Complications: No apparent complications Patient did tolerate procedure well. Chest X-ray ordered to verify placement.  CXR: tube position acceptable.   Gonzella Lex 01/31/2018

## 2018-02-06 NOTE — Procedures (Signed)
  PROCEDURE: Right frontal ventriculostomy   SURGEON: Dr. Consuella Lose, MD  ANESTHESIA: Local  EBL: Minimal  SPECIMENS: None  COMPLICATIONS: None  CONDITION: Hemodynamically stable  INDICATIONS: Mrs. Jared Vazquez is a 63 y.o. male presenting with obtundation after cerebellar stroke. CT demonstrated effacement of the fourth ventricle with obstructive hydrocephalus. EVD was therefore indicated.  PROCEDURE IN DETAIL: After consent was obtained from the patient's family, skin of the right frontal scalp was clipped, prepped and draped in the usual sterile fashion.  Scalp was then infiltrated with local anesthetic with epinephrine.  Skin incision was made sharply, and twist drill burr hole was made.  The dura was then incised, and the ventricular catheter was passed in a single attempt into the right lateral ventricle.  Good CSF flow was obtained.  The catheter was then tunneled subcutaneously and connected to a drainage system and the skin incision closed.  The drain was then secured in place.  FINDINGS: 1. Opening pressure >20cmH2O 2. Clear CSF

## 2018-02-06 NOTE — Progress Notes (Signed)
Dr. Kathyrn Sheriff placed the IVC drain at bedside, [id number], and ordered drain to be at 5 mmHg.

## 2018-02-06 NOTE — ED Notes (Addendum)
Returned from ct  [facility] elevated

## 2018-02-06 NOTE — Procedures (Signed)
Arterial Catheter Insertion Procedure Note Jared Vazquez [id number] [birth year]  Procedure: Insertion of Arterial Catheter  Indications: Blood pressure monitoring and Frequent blood sampling  Procedure Details Consent: Unable to obtain consent because of emergent medical necessity. Time Out: Verified patient identification, verified procedure, site/side was marked, verified correct patient position, special equipment/implants available, medications/allergies/relevent history reviewed, required imaging and test results available.  Performed  Maximum sterile technique was used including antiseptics, cap, gloves, gown, hand hygiene, mask and sheet. Skin prep: Chlorhexidine; local anesthetic administered 20 gauge catheter was inserted into right radial artery using the Seldinger technique. ULTRASOUND GUIDANCE USED: NO Evaluation Blood flow good; BP tracing good. Complications: No apparent complications.   Jared Vazquez 02/02/2018

## 2018-02-06 NOTE — ED Notes (Signed)
Vinney PA with neuro here at bedside spoke with wife and up dated on patient status.

## 2018-02-06 NOTE — Progress Notes (Signed)
Pt transported to CT and back without complications. 

## 2018-02-06 NOTE — Consult Note (Signed)
NEURO HOSPITALIST CONSULT NOTE   Requestig physician: Dr. Roxanne Mins  Reason for Consult: Unresponsive  History obtained from:  EMS  HPI:                                                                                                                                          Jared Vazquez is an 63 y.o. male with a history of HTN and DM who presents as a code stroke via EMS. LKN was on Wednesday, when he started to experience dizziness with N/V. He went to an OSH ED on Thursday and was diagnosed with vertigo, then discharged home. Early this AM he awoke and got up, at which time his wife noticed that he was stumbling and unsteady on his feet. At 6 AM he acutely deteriorated and became unresponsive. His wife then called EMS. On EMS arrival, the patient was unresponsive with "left" facial droop. CBG 184, sats 93% on O2, BP 177/105. On arrival to the ED he remained unresponsive. Head was turned to the left. No seizure activity noted. Wife denied opiate use at home.   Past Medical History:  Diagnosis Date  . Diabetes mellitus without complication ([place])   . Gout   . Hypertension     No past surgical history on file.  Family History  Problem Relation Age of Onset  . High blood pressure Mother   . High blood pressure Father               Social History:  reports that he has never smoked. He has never used smokeless tobacco. He reports current alcohol use. He reports that he does not use drugs.  No Known Allergies  MEDICATIONS:                                                                                                                     Metformin Naproxen  Nebivolol Tribenzor   ROS:  Unable to obtain due to AMS.  Vitals following intubation: Current BP: 155/106 HR: 76   General Examination:                                                                                                       Physical Exam  HEENT-  [place]/AT   Lungs- Sonorous respirations with grossly audible upper airway sounds prior to intubation Extremities- Warm and well perfused   Neurological Examination: (prior to intubation) Mental Status: Obtunded. Will weakly and nonpurposefully thrash LUE to noxious stimuli. No purposeful movements. Eyes open and not reacting to visual stimuli. No response to commands.  Cranial Nerves: II: No blink to threat. Pupils 2 mm and unreactive   III,IV, VI: Eyes are proptotic bilaterally. Weak doll's eye reflexes bilaterally. Eyes mildly exotropic at midline. No nystagmus.  V,VII: Right facial droop. No significant response to brow ridge pressure VIII: No response to auditory stimuli IX,X: Sonorous respirations prior to intubation XI: Head turns to the left at baseline and after passive rotation to the right XII: midline tongue when mouth is held open Motor: RUE minimal movement to sternal rub 1-2/5.  RLE: Slow withdrawal to plantar stimulation 2/5 LUE: Nonpurposeful thrashing movements antigravity to noxious LLE: Brisk withdrawal to plantar simulation 3/5 Tone decreased RUE and RLE Sensory: Withdrawal to noxious BLE  Deep Tendon Reflexes: 1+ brachioradialis and biceps. 1+ patellae bilaterally. 0 achilles bilaterally.  Plantars: Right: downgoing   Left: downgoing Cerebellar/Gait: Unable to assess   Lab Results: Basic Metabolic Panel: No results for input(s): NA, K, CL, CO2, GLUCOSE, BUN, CREATININE, CALCIUM, MG, PHOS in the last 168 hours.  CBC: No results for input(s): WBC, NEUTROABS, HGB, HCT, MCV, PLT in the last 168 hours.  Cardiac Enzymes: No results for input(s): CKTOTAL, CKMB, CKMBINDEX, TROPONINI in the last 168 hours.  Lipid Panel: No results for input(s): CHOL, TRIG, HDL, CHOLHDL, VLDL, LDLCALC in the last 168 hours.  Imaging: No results found.  CT head:  1. Acute/subacute infarct in the  cerebellum bilaterally right greater than left. Extensive edema is causing moderate obstructive hydrocephalus. 2. Hyperdense basilar raises the possibility of thrombus however this could be blood pool effect. 3. ASPECTS is 10.  EKG:  Sinus rhythm Posterior infarct, old Borderline repolarization abnormality  Assessment: 63 year old male with acute decompensation secondary to obstructive hydrocephalus precipitated by large cerebellar ischemic infarction with mass effect 1. Exam reveals an obtunded patient with right worse than left sided motor weakness. The asymmetric motor weakness is a false localizing sign as it is his right brainstem which is more compressed by the cerebellar stroke than the left side.  2. S/P intubation for airway protection 3. History of DM and HTN    Recommendations: 1. STAT Neurosurgery consult for consideration of ventricular drain placement. I have personally spoken with the Neurosurgery NP on call and he has stated that he will contact his attending STAT 2. Wife not currently available for consent to obtain procedure. She is not answering telephone. May need to have 2-physician consensus decision for emergent procedure if wife still  not available during Neurosurgery evaluation 3. Hypertonic saline has been started.  4. Will need ICU admission under CCM or Neurosurgery  60 minutes spent in the emergent neurological evaluation and management of this critically ill patient with acute hydrocephalus  Electronically signed: Dr. Kerney Elbe 01/25/2018, 8:03 AM

## 2018-02-06 NOTE — H&P (Signed)
NAME:  Jared Vazquez, MRN:  [id number], DOB:  [birth year], LOS: 0 ADMISSION DATE:  02/01/2018, CONSULTATION DATE:  02/02/2018 REFERRING MD:  EDP - Plunkett, CHIEF COMPLAINT:  Ischemic CVA   Brief History   63 year old male diabetic and hypertensive male who had vertigo on 1/22 and presented to [facility] where he was discharged but unsure what the work-up was.  On 1/24 he was found unresponsive at home and EMS was called.  Patient was brought to [facility] where he was found to have a large left posterior circulation stroke with hydrocephalus and was intubated for airway protection and transferred to the ICU for IVD to be placed.  PCCM was asked to admit for critical care management.  No family bedside, no further history available beyond records.  History of present illness   63 year old male diabetic and hypertensive male who had vertigo on 1/22 and presented to [facility] where he was discharged but unsure what the work-up was.  On 1/24 he was found unresponsive at home and EMS was called.  Patient was brought to [facility] where he was found to have a large left posterior circulation stroke with hydrocephalus and was intubated for airway protection and transferred to the ICU for IVD to be placed.  PCCM was asked to admit for critical care management.  No family bedside, no further history available beyond records.  Past Medical History  HTN DM  Significant Hospital Events   1/24 IVD placed, intubated and admitted to the ICU  Consults:  PCCM Neuro NS  Procedures:  IVD on the R 1/24>>>  Significant Diagnostic Tests:  Head CT 1/24>>> that I personally reviewed with left PCA CVA  Micro Data:  N/A  Antimicrobials:  N/A   Interim history/subjective:  No events since admission  Objective   Blood pressure (!) 164/100, pulse 71, temperature 100.2 F (37.9 C), temperature source Axillary, resp. rate 16, height 6' (1.829 m), weight 89.3 kg, SpO2 100 %.    Vent Mode: PRVC FiO2 (%):   [50 %-100 %] 50 % Set Rate:  [16 bmp] 16 bmp Vt Set:  [600 mL] 600 mL PEEP:  [5 cmH20] 5 cmH20 Plateau Pressure:  [16 cmH20] 16 cmH20   Intake/Output Summary (Last 24 hours) at 02/07/2018 1011 Last data filed at 02/10/2018 0751 Gross per 24 hour  Intake 0 ml  Output -  Net 0 ml   Filed Weights   02/01/2018 0852  Weight: 89.3 kg    Examination: General: Acutely ill appearing male, NAD HENT: [place]/AT, PERRL, EOM-I and MMM Lungs: CTA bilaterally Cardiovascular: RRR, Nl S1/S2 and -M/R/G Abdomen: Soft, NT, ND and +BS Extremities: -edema and -tenderness Neuro: Unresponsive, does not withdraw to pain but on propofol for IVD placement Skin: Intact  Resolved Hospital Problem list   N/A  Assessment & Plan:  63 year old male with ischemic CVA who presents to PCCM with respiratory failure, AMS, hydrocephalus and hypertension.  Discussed with neurology and NS.  VDRF:  - ABG now  - Full vent support  - Adjust vent for ABG  - Titrate O2 for sat of 88-92%  HTN:  - Neurology to manage  - Propofol on board  DM:  - ISS  - CBG  CVA:  - IVD per NS  - 3% per neuro  - BP management per neuro  - Will place TLC  FEN:  - TF per nutrition  - SCD's  - Defer anticoagulation to neurology  PCCM will  admit   Labs   CBC: Recent Labs  Lab 02/08/2018 0754 01/22/2018 0836 02/01/2018 0949  WBC 17.0*  --   --   NEUTROABS 14.8*  --   --   HGB 15.0 15.6 14.3  HCT 47.6 46.0 42.0  MCV 89.1  --   --   PLT 249  --   --     Basic Metabolic Panel: Recent Labs  Lab 01/26/2018 0754 02/11/2018 0836 01/26/2018 0841 02/11/2018 0949  NA 140 135  --  139  K 4.2 6.8*  --  3.6  CL 103  --   --   --   CO2 25  --   --   --   GLUCOSE 183*  --   --   --   BUN 19  --   --   --   CREATININE 1.50*  --  1.40*  --   CALCIUM 9.0  --   --   --    GFR: Estimated Creatinine Clearance: 60 mL/min (A) (by C-G formula based on SCr of 1.4 mg/dL (H)). Recent Labs  Lab 02/03/2018 0754  WBC 17.0*    Liver Function  Tests: Recent Labs  Lab 01/28/2018 0754  AST 40  ALT 14  ALKPHOS 56  BILITOT 1.1  PROT 7.3  ALBUMIN 3.5   No results for input(s): LIPASE, AMYLASE in the last 168 hours. No results for input(s): AMMONIA in the last 168 hours.  ABG    Component Value Date/Time   PHART 7.523 (H) 01/21/2018 0949   PCO2ART 35.3 02/03/2018 0949   PO2ART 464.0 (H) 01/28/2018 0949   HCO3 29.1 (H) 02/10/2018 0949   TCO2 30 01/25/2018 0949   O2SAT 100.0 01/28/2018 0949     Coagulation Profile: Recent Labs  Lab 01/24/2018 0754  INR 1.15    Cardiac Enzymes: No results for input(s): CKTOTAL, CKMB, CKMBINDEX, TROPONINI in the last 168 hours.  HbA1C: No results found for: HGBA1C  CBG: Recent Labs  Lab 01/24/2018 0753  GLUCAP 157*    Review of Systems:   Unattainable  Past Medical History  He,  has a past medical history of Diabetes mellitus without complication ([place]), Gout, and Hypertension.   Surgical History   History reviewed. No pertinent surgical history.   Social History   reports that he has never smoked. He has never used smokeless tobacco. He reports current alcohol use. He reports that he does not use drugs.   Family History   His family history includes High blood pressure in his father and mother.   Allergies No Known Allergies   Home Medications  Prior to Admission medications   Medication Sig Start Date End Date Taking? Authorizing Provider  metFORMIN (GLUCOPHAGE) 500 MG tablet Take 500 mg by mouth daily with breakfast.    [provider]  naproxen (NAPROSYN) 500 MG tablet Take 1 tablet (500 mg total) by mouth 2 (two) times daily with a meal. 09/26/16   Sanjuana Kava, MD  nebivolol (BYSTOLIC) 10 MG tablet Take 10 mg by mouth daily.    [provider]  TRIBENZOR 40-10-25 MG TABS Take 1 tablet by mouth daily. 11/21/14   [provider]    The patient is critically ill with multiple organ systems failure and requires high complexity decision  making for assessment and support, frequent evaluation and titration of therapies, application of advanced monitoring technologies and extensive interpretation of multiple databases.   Critical Care Time devoted to patient care services described in this  note is  45  Minutes. This time reflects time of care of this signee Dr [name]. This critical care time does not reflect procedure time, or teaching time or supervisory time of PA/NP/Med student/Med Resident etc but could involve care discussion time.  Rush Farmer, M.D. [facility] Pulmonary/Critical Care Medicine. Pager: [phone]. After hours pager: [phone].

## 2018-02-06 NOTE — Consult Note (Signed)
Chief Complaint   Chief Complaint  Patient presents with  . Code Stroke    HPI   Consult requested by: Dr Cheral Marker Reason for consult: cerebellar stroke, hydrocephalus, ?EVD placeent  HPI: Jared Vazquez is a 63 y.o. male with history of DM and HTN (noncompliant with medication) who was brought to ER as code stroke after being found unresponsive at approx 0500am today. Patient intubated. Wife at bedside giving history. History begins on Wednesday when patient developed nausea and dizziness. Symptoms thought to be related to [place] that he had eaten prior. On Thursday, wife left for work and patient was doing well initially however she received a call from her kids stating he was having severe dizziness, nausea and vomiting. They called EMS and went to [facility] ER where he was diagnosed with vertigo and d/c home with zofran and meclizine. Unfortunately he continued to have significant gait instability, dizziness, nausea and vomiting throughout the night and was ultimately found unresponsive this am. CT head revealed large cerebellar ischemic infarct with hydrocephalus. NSY consulted for possible intervention.  Patient is on bistolic and metformin. No blood thinning agents.  Patient Active Problem List   Diagnosis Date Noted  . Cerebellar stroke ([place]) 01/30/2018  . Hydrocephalus ([place]) 02/03/2018    PMH: Past Medical History:  Diagnosis Date  . Diabetes mellitus without complication ([place])   . Gout   . Hypertension     PSH: History reviewed. No pertinent surgical history.  (Not in a hospital admission)   SH: Social History   Tobacco Use  . Smoking status: Never Smoker  . Smokeless tobacco: Never Used  Substance Use Topics  . Alcohol use: Yes    Comment: occasionally  . Drug use: No    MEDS: Prior to Admission medications   Medication Sig Start Date End Date Taking? Authorizing Provider  metFORMIN (GLUCOPHAGE) 500 MG tablet Take 500 mg by mouth daily with breakfast.     [provider]  naproxen (NAPROSYN) 500 MG tablet Take 1 tablet (500 mg total) by mouth 2 (two) times daily with a meal. 09/26/16   Sanjuana Kava, MD  nebivolol (BYSTOLIC) 10 MG tablet Take 10 mg by mouth daily.    [provider]  TRIBENZOR 40-10-25 MG TABS Take 1 tablet by mouth daily. 11/21/14   [provider]    ALLERGY: No Known Allergies  Social History   Tobacco Use  . Smoking status: Never Smoker  . Smokeless tobacco: Never Used  Substance Use Topics  . Alcohol use: Yes    Comment: occasionally     Family History  Problem Relation Age of Onset  . High blood pressure Mother   . High blood pressure Father      ROS   ROS unable to obtain. intubated  Exam   Vitals:   01/14/2018 0845 01/23/2018 0849  BP: (!) 148/98   Pulse: 71   Resp: 16   SpO2: 100% 98%   Intubated, on propofol Pinpoint pupils, nonractive Minimal left facial grimace to sternal rub No other response to painful stimulus - gag, - corneal although on propofol   Results - Imaging/Labs   Results for orders placed or performed during the hospital encounter of 02/12/2018 (from the past 48 hour(s))  CBG monitoring, ED     Status: Abnormal   Collection Time: 02/12/2018  7:53 AM  Result Value Ref Range   Glucose-Capillary 157 (H) 70 - 99 mg/dL  CBC     Status: Abnormal  Collection Time: 01/18/2018  7:54 AM  Result Value Ref Range   WBC 17.0 (H) 4.0 - 10.5 K/uL   RBC 5.34 4.22 - 5.81 MIL/uL   Hemoglobin 15.0 13.0 - 17.0 g/dL   HCT 47.6 39.0 - 52.0 %   MCV 89.1 80.0 - 100.0 fL   MCH 28.1 26.0 - 34.0 pg   MCHC 31.5 30.0 - 36.0 g/dL   RDW 12.8 11.5 - 15.5 %   Platelets 249 150 - 400 K/uL   nRBC 0.0 0.0 - 0.2 %    Comment: Performed at [facility], [place] [address], [place], [place] [address]  Differential     Status: Abnormal   Collection Time: 01/18/2018  7:54 AM  Result Value Ref Range   Neutrophils Relative % 86 %   Neutro Abs 14.8 (H) 1.7 - 7.7 K/uL   Lymphocytes  Relative 5 %   Lymphs Abs 0.8 0.7 - 4.0 K/uL   Monocytes Relative 8 %   Monocytes Absolute 1.3 (H) 0.1 - 1.0 K/uL   Eosinophils Relative 0 %   Eosinophils Absolute 0.0 0.0 - 0.5 K/uL   Basophils Relative 0 %   Basophils Absolute 0.0 0.0 - 0.1 K/uL   Immature Granulocytes 1 %   Abs Immature Granulocytes 0.08 (H) 0.00 - 0.07 K/uL    Comment: Performed at [facility] [address] [place], [place] [address]  I-stat troponin, ED     Status: None   Collection Time: 01/17/2018  8:19 AM  Result Value Ref Range   Troponin i, poc 0.02 0.00 - 0.08 ng/mL   Comment 3            Comment: Due to the release kinetics of cTnI, a negative result within the first hours of the onset of symptoms does not rule out myocardial infarction with certainty. If myocardial infarction is still suspected, repeat the test at appropriate intervals.   POCT I-Stat EG7     Status: Abnormal   Collection Time: 02/07/2018  8:36 AM  Result Value Ref Range   pH, Ven 7.520 (H) 7.250 - 7.430   pCO2, Ven 33.6 (L) 44.0 - 60.0 mmHg   pO2, Ven 221.0 (H) 32.0 - 45.0 mmHg   Bicarbonate 27.4 20.0 - 28.0 mmol/L   TCO2 28 22 - 32 mmol/L   O2 Saturation 100.0 %   Acid-Base Excess 5.0 (H) 0.0 - 2.0 mmol/L   Sodium 135 135 - 145 mmol/L   Potassium 6.8 (HH) 3.5 - 5.1 mmol/L   Calcium, Ion 0.91 (L) 1.15 - 1.40 mmol/L   HCT 46.0 39.0 - 52.0 %   Hemoglobin 15.6 13.0 - 17.0 g/dL   Patient temperature HIDE    Sample type VENOUS    Comment NOTIFIED PHYSICIAN   I-Stat Creatinine, ED (do not order at [facility])     Status: Abnormal   Collection Time: 02/08/2018  8:41 AM  Result Value Ref Range   Creatinine, Ser 1.40 (H) 0.61 - 1.24 mg/dL    Dg Chest Portable 1 View  Result Date: 01/25/2018 CLINICAL DATA:  Hypoxia EXAM: PORTABLE CHEST 1 VIEW COMPARISON:  July 10, 2015 FINDINGS: Endotracheal tube tip is 2.9 cm above the carina. No pneumothorax. There is no edema or consolidation. Heart is upper normal in size with pulmonary  vascularity normal. No adenopathy no bone lesions. IMPRESSION: Endotracheal tube as described without pneumothorax. No edema or consolidation. Stable cardiac silhouette. Electronically Signed   By: Lowella Grip III M.D.  On: 01/20/2018 08:27   Ct Head Code Stroke Wo Contrast  Result Date: 01/22/2018 CLINICAL DATA:  Code stroke.  Unresponsive EXAM: CT HEAD WITHOUT CONTRAST TECHNIQUE: Contiguous axial images were obtained from the base of the skull through the vertex without intravenous contrast. COMPARISON:  CT head 07/10/2015 FINDINGS: Brain: Extensive hypodensity in the right inferior cerebellum compatible with subacute infarct. Infarct crosses the midline to the left cerebellum. Inferior vermis involved with infarct. Large amount of edema and mass-effect. Obstructive hydrocephalus. Third and fourth ventricles and aqueduct are diffusely dilated. Fourth ventricle mildly dilated. Negative for acute hemorrhage.  No other area of acute infarct. Vascular: Hyperdensity of the basilar is slightly greater than the density of the carotid. This could be due to basilar thrombosis however this could be due to blood pool effect. Skull: Negative Sinuses/Orbits: Negative Other: None ASPECTS ([place] Stroke Program Early CT Score) - Ganglionic level infarction (caudate, lentiform nuclei, internal capsule, insula, M1-M3 cortex): 7 - Supraganglionic infarction (M4-M6 cortex): 3 Total score (0-10 with 10 being normal): 10 IMPRESSION: 1. Acute/subacute infarct in the cerebellum bilaterally right greater than left. Extensive edema is causing moderate obstructive hydrocephalus. 2. Hyperdense basilar raises the possibility of thrombus however this could be blood pool effect. 3. ASPECTS is 10. 4. These results were called by telephone at the time of interpretation on 01/27/2018 at 8:37 am to Dr. Cheral Marker , who verbally acknowledged these results. Electronically Signed   By: Franchot Gallo M.D.   On: 01/25/2018 08:38      Impression/Plan   63 y.o. male with large cerebellar ischemic infarct resulting in obstructive hydrocephalus. No compression of brainstem. He is intubated and on propofol so exam is limited. Will proceed with emergent EVD placement due to hydrocephalus without brain stem compression.

## 2018-02-06 NOTE — Procedures (Signed)
Central Venous Catheter Insertion Procedure Note Jared Vazquez [id number] 1955-02-13  Procedure: Insertion of Central Venous Catheter Indications: Assessment of intravascular volume, Drug and/or fluid administration and Frequent blood sampling  Procedure Details Consent: Unable to obtain consent because of emergent medical necessity. Time Out: Verified patient identification, verified procedure, site/side was marked, verified correct patient position, special equipment/implants available, medications/allergies/relevent history reviewed, required imaging and test results available.  Performed  Maximum sterile technique was used including antiseptics, cap, gloves, gown, hand hygiene, mask and sheet. Skin prep: Chlorhexidine; local anesthetic administered A antimicrobial bonded/coated triple lumen catheter was placed in the left subclavian vein using the Seldinger technique.  Evaluation Blood flow good Complications: No apparent complications Patient did tolerate procedure well. Chest X-ray ordered to verify placement.  CXR: pending.  YACOUB,WESAM 01/14/2018, 10:40 AM

## 2018-02-06 NOTE — Progress Notes (Signed)
Initial Nutrition Assessment  DOCUMENTATION CODES:   Not applicable  INTERVENTION:   Initiate Vital AF 1.2 @ 65 ml/hr via OG tube 30 ml Prostat daily  Provides: 1972 kcal, 132 grams protein, and 1265 ml free water TF regimen and propofol at current rate providing 2113  total kcal/day (102 % of kcal needs)   NUTRITION DIAGNOSIS:   Inadequate oral intake related to inability to eat as evidenced by NPO status.  GOAL:   Patient will meet greater than or equal to 90% of their needs  MONITOR:   TF tolerance, I & O's  REASON FOR ASSESSMENT:   Consult Enteral/tube feeding initiation and management  ASSESSMENT:   Pt with PMH of HTN and DM admitted with large L posterior circulation stroke with hydrocephalus.    Pt intubated and IVD placed.   Patient is currently intubated on ventilator support MV: 9.4 L/min Temp (24hrs), Avg:100.2 F (37.9 C), Min:100.2 F (37.9 C), Max:100.2 F (37.9 C)  Propofol: 5.36 ml/hr (10 mcg) provides: 141 kcal Medications reviewed and include: SSI Labs reviewed MAP: 121   NUTRITION - FOCUSED PHYSICAL EXAM:   Unable to complete Nutrition-Focused physical exam at this time.  Pt having central line placed.   Diet Order:   Diet Order            Diet NPO time specified  Diet effective now              EDUCATION NEEDS:   No education needs have been identified at this time  Skin:  Skin Assessment: (no data yet)  Last BM:  unknown  Height:   Ht Readings from Last 1 Encounters:  01/29/2018 6' (1.829 m)    Weight:   Wt Readings from Last 1 Encounters:  01/21/2018 89.3 kg    Ideal Body Weight:  80.9 kg  BMI:  Body mass index is 26.7 kg/m.  Estimated Nutritional Needs:   Kcal:  2074  Protein:  107-125 grams  Fluid:  > 2L/day  [place], [place], [place] Pager [phone] After Hours Pager

## 2018-02-06 NOTE — Progress Notes (Signed)
Change made after ABG results.

## 2018-02-06 NOTE — Code Documentation (Signed)
Initially LKW was thought to be 0200.  Per EMS Wife heard him stumbling around at 0200 at 0600 he was unresponsive.  Code Stroke was called enroute.  Upon arrival patient with difficulty protecting airway.  Taken to Trauma C for urgent intubation. Placed on propofol for sedation.  NIHSS 30.  Narcan attempted prior to intubation with minimal change.   Upon further investigation LKW was changed to Wednesday.  Per report he started having dizziness and nausea and vomiting Wednesday.  Labs and head CT done.   Plan admit 4N ICU, NS at bedside to assess patient.  Admitted to [phone]

## 2018-02-06 NOTE — ED Provider Notes (Signed)
[place] EMERGENCY DEPARTMENT Provider Note   CSN: [id number] Arrival date & time: 01/18/2018  0751     History   Chief Complaint No chief complaint on file.   HPI Jared Vazquez is a 63 y.o. male.  Patient is a 63 year old male presenting by EMS as a code stroke.  Patient has a history of hypertension and diabetes and per wife at his home on Wednesday he developed nausea vomiting and dizziness.  He went to [facility] emergency room yesterday where per wife he was diagnosed with vertigo and discharged home.  Approximately 2 AM this morning patient started stumbling around and not acting himself.  By the time she called 911 this morning he was not responding.  EMS stated that upon their arrival he would withdrawal to pain and groan intermittently to painful stimuli.  Blood sugar was 152.  He has been hypertensive throughout his route.  No history of narcotic use.  Prior to this patient was a normal functioning 63 year old.  No prior history of stroke.  He is not on anticoagulation.  The history is provided by the spouse and the EMS personnel.    Past Medical History:  Diagnosis Date  . Diabetes mellitus without complication ([place])   . Gout   . Hypertension     There are no active problems to display for this patient.   No past surgical history on file.      Home Medications    Prior to Admission medications   Medication Sig Start Date End Date Taking? Authorizing Provider  metFORMIN (GLUCOPHAGE) 500 MG tablet Take 500 mg by mouth daily with breakfast.    [provider]  naproxen (NAPROSYN) 500 MG tablet Take 1 tablet (500 mg total) by mouth 2 (two) times daily with a meal. 09/26/16   Sanjuana Kava, MD  nebivolol (BYSTOLIC) 10 MG tablet Take 10 mg by mouth daily.    [provider]  TRIBENZOR 40-10-25 MG TABS Take 1 tablet by mouth daily. 11/21/14   [provider]    Family History Family History  Problem Relation Age of Onset    . High blood pressure Mother   . High blood pressure Father     Social History Social History   Tobacco Use  . Smoking status: Never Smoker  . Smokeless tobacco: Never Used  Substance Use Topics  . Alcohol use: Yes    Comment: occasionally  . Drug use: No     Allergies   Patient has no known allergies.   Review of Systems Review of Systems  Unable to perform ROS: Mental status change     Physical Exam Updated Vital Signs There were no vitals taken for this visit.  Physical Exam Vitals signs and nursing note reviewed.  Constitutional:      Appearance: He is well-developed. He is toxic-appearing.     Comments: Somnolent, minimally responsive to pain  HENT:     Head: Normocephalic and atraumatic.  Eyes:     Comments: 3mm non-reactive pupils.  Mild exophthalmos  Neck:     Musculoskeletal: Normal range of motion and neck supple.  Cardiovascular:     Rate and Rhythm: Normal rate and regular rhythm.     Heart sounds: No murmur.  Pulmonary:     Effort: Pulmonary effort is normal. No respiratory distress.     Breath sounds: Normal breath sounds. No wheezing or rales.     Comments: Sonorous breath sounds with occasional gurgling Abdominal:  General: There is no distension.     Palpations: Abdomen is soft.     Tenderness: There is no abdominal tenderness. There is no guarding or rebound.  Musculoskeletal: Normal range of motion.        General: No tenderness.  Skin:    General: Skin is warm and dry.     Findings: No erythema or rash.  Neurological:     Mental Status: He is unresponsive.     GCS: GCS eye subscore is 1. GCS verbal subscore is 2. GCS motor subscore is 4.     Comments: Possible mild right facial droop.  With pain patient does not withdrawal his left hand and leg more so than he does on the right.  Seems to have weakness in the right arm and leg.  No gaze preference.  Will occasionally moan with sternal rub.  No Babinski bilaterally  Psychiatric:      Comments: Unresponsive      ED Treatments / Results  Labs (all labs ordered are listed, but only abnormal results are displayed) Labs Reviewed  CBC - Abnormal; Notable for the following components:      Result Value   WBC 17.0 (*)    All other components within normal limits  DIFFERENTIAL - Abnormal; Notable for the following components:   Neutro Abs 14.8 (*)    Monocytes Absolute 1.3 (*)    Abs Immature Granulocytes 0.08 (*)    All other components within normal limits  CBG MONITORING, ED - Abnormal; Notable for the following components:   Glucose-Capillary 157 (*)    All other components within normal limits  I-STAT CREATININE, ED - Abnormal; Notable for the following components:   Creatinine, Ser 1.40 (*)    All other components within normal limits  POCT I-STAT EG7 - Abnormal; Notable for the following components:   pH, Ven 7.520 (*)    pCO2, Ven 33.6 (*)    pO2, Ven 221.0 (*)    Acid-Base Excess 5.0 (*)    Potassium 6.8 (*)    Calcium, Ion 0.91 (*)    All other components within normal limits  PROTIME-INR  APTT  ETHANOL  COMPREHENSIVE METABOLIC PANEL  RAPID URINE DRUG SCREEN, HOSP PERFORMED  URINALYSIS, ROUTINE W REFLEX MICROSCOPIC  TRIGLYCERIDES  SODIUM  SODIUM  SODIUM  I-STAT TROPONIN, ED    EKG EKG Interpretation  Date/Time:  Friday February 06 2018 07:57:49 EST Ventricular Rate:  82 PR Interval:    QRS Duration: 87 QT Interval:  375 QTC Calculation: 438 R Axis:   -8 Text Interpretation:  Sinus rhythm Posterior infarct, old Borderline repolarization abnormality No significant change since last tracing Confirmed by Blanchie Dessert [phone]) on 02/07/2018 8:10:43 AM   Radiology Dg Chest Portable 1 View  Result Date: 02/04/2018 CLINICAL DATA:  Hypoxia EXAM: PORTABLE CHEST 1 VIEW COMPARISON:  July 10, 2015 FINDINGS: Endotracheal tube tip is 2.9 cm above the carina. No pneumothorax. There is no edema or consolidation. Heart is upper normal in size with  pulmonary vascularity normal. No adenopathy no bone lesions. IMPRESSION: Endotracheal tube as described without pneumothorax. No edema or consolidation. Stable cardiac silhouette. Electronically Signed   By: Lowella Grip III M.D.   On: 02/08/2018 08:27   Ct Head Code Stroke Wo Contrast  Result Date: 01/20/2018 CLINICAL DATA:  Code stroke.  Unresponsive EXAM: CT HEAD WITHOUT CONTRAST TECHNIQUE: Contiguous axial images were obtained from the base of the skull through the vertex without intravenous contrast. COMPARISON:  CT head  07/10/2015 FINDINGS: Brain: Extensive hypodensity in the right inferior cerebellum compatible with subacute infarct. Infarct crosses the midline to the left cerebellum. Inferior vermis involved with infarct. Large amount of edema and mass-effect. Obstructive hydrocephalus. Third and fourth ventricles and aqueduct are diffusely dilated. Fourth ventricle mildly dilated. Negative for acute hemorrhage.  No other area of acute infarct. Vascular: Hyperdensity of the basilar is slightly greater than the density of the carotid. This could be due to basilar thrombosis however this could be due to blood pool effect. Skull: Negative Sinuses/Orbits: Negative Other: None ASPECTS ([place] Stroke Program Early CT Score) - Ganglionic level infarction (caudate, lentiform nuclei, internal capsule, insula, M1-M3 cortex): 7 - Supraganglionic infarction (M4-M6 cortex): 3 Total score (0-10 with 10 being normal): 10 IMPRESSION: 1. Acute/subacute infarct in the cerebellum bilaterally right greater than left. Extensive edema is causing moderate obstructive hydrocephalus. 2. Hyperdense basilar raises the possibility of thrombus however this could be blood pool effect. 3. ASPECTS is 10. 4. These results were called by telephone at the time of interpretation on 01/15/2018 at 8:37 am to Dr. Cheral Marker , who verbally acknowledged these results. Electronically Signed   By: Franchot Gallo M.D.   On: 01/17/2018 08:38     Procedures Procedure Name: Intubation Date/Time: 02/12/2018 8:18 AM Performed by: Blanchie Dessert, MD Pre-anesthesia Checklist: Patient identified, Patient being monitored, Emergency Drugs available, Timeout performed and Suction available Oxygen Delivery Method: Nasal cannula Preoxygenation: Pre-oxygenation with 100% oxygen Induction Type: Rapid sequence Ventilation: Mask ventilation without difficulty Laryngoscope Size: Glidescope and 4 Grade View: Grade III Tube size: 8.0 mm Number of attempts: 1 Airway Equipment and Method: Video-laryngoscopy Placement Confirmation: ETT inserted through vocal cords under direct vision,  CO2 detector and Breath sounds checked- equal and bilateral Secured at: 23 cm Tube secured with: ETT holder Dental Injury: Teeth and Oropharynx as per pre-operative assessment  Difficulty Due To: Difficulty was unanticipated      (including critical care time)  Medications Ordered in ED Medications  propofol (DIPRIVAN) 1000 MG/100ML infusion (has no administration in time range)  propofol (DIPRIVAN) 1000 MG/100ML infusion (has no administration in time range)  naloxone (NARCAN) 0.4 MG/ML injection (0.4 mg  Given 01/27/2018 0755)     Initial Impression / Assessment and Plan / ED Course  I have reviewed the triage vital signs and the nursing notes.  Pertinent labs & imaging results that were available during my care of the patient were reviewed by me and considered in my medical decision making (see chart for details).     Patient presenting this morning as a code stroke.  Patient was unresponsive upon arrival with some withdrawal to pain and sternal rub.  Seems to have right-sided deficits as well.  Altered mental status and concern for airway compromise.  Patient seen yesterday and diagnosed with peripheral vertigo but prior to this he was a normal functioning 63 year old.  Does have stroke risk factors and concern for bleed versus brainstem stroke.   Blood sugar was within normal limits.  Patient has been hypertensive here.  Stroke team is at bedside however unclear what his window is given he has had symptoms since Wednesday but his significant mental status changes did not start till 2 AM. After intubation which was uneventful patient was started on propofol to contain neurologic exam easily.  Patient is not on anticoagulation.  EKG without acute findings.  Labs are pending  9:32 AM CT is consistent with extensive cerebellar stroke with surrounding edema and obstructive hydrocephalus.  Neurosurgery consulted  for possible drain placement.  Patient remained stable on the vent.  EEG 7 showed a potassium of 6.8 but CMP is still pending.  EKG, troponin, creatinine are stable.  Chest x-ray with good tube placement.  CRITICAL CARE Performed by: Arley Salamone Total critical care time: 30 minutes Critical care time was exclusive of separately billable procedures and treating other patients. Critical care was necessary to treat or prevent imminent or life-threatening deterioration. Critical care was time spent personally by me on the following activities: development of treatment plan with patient and/or surrogate as well as nursing, discussions with consultants, evaluation of patient's response to treatment, examination of patient, obtaining history from patient or surrogate, ordering and performing treatments and interventions, ordering and review of laboratory studies, ordering and review of radiographic studies, pulse oximetry and re-evaluation of patient's condition.   Final Clinical Impressions(s) / ED Diagnoses   Final diagnoses:  Obstructive hydrocephalus ([place])  Acute ischemic stroke [facility])    ED Discharge Orders    None       Blanchie Dessert, MD 02/08/2018 [phone]

## 2018-02-06 NOTE — Progress Notes (Signed)
Full consult to follow.  I have seen and examined [name], spoke with his wife, and reviewed his imaging today. He has likely suffered right PICA territory stroke with decompensation due to HCP. In review of his CT there is significant effacement of the fourth ventricle, however I do not see compression of the medulla or pons. There is significant obstructive hydrocephalus. Will place EVD at bedside. I have reviewed the situation with the patient's wife. My recommendation for EVD placement was discussed. Risks including infection and hemorrhage were reviewed. She provided consent to proceed.

## 2018-02-07 ENCOUNTER — Inpatient Hospital Stay (HOSPITAL_COMMUNITY): Payer: 59

## 2018-02-07 ENCOUNTER — Encounter (HOSPITAL_COMMUNITY): Admission: EM | Disposition: E | Payer: Self-pay | Source: Home / Self Care | Attending: Neurology

## 2018-02-07 ENCOUNTER — Inpatient Hospital Stay (HOSPITAL_COMMUNITY): Payer: 59 | Admitting: Anesthesiology

## 2018-02-07 ENCOUNTER — Encounter (HOSPITAL_COMMUNITY): Payer: Self-pay | Admitting: Certified Registered"

## 2018-02-07 DIAGNOSIS — G911 Obstructive hydrocephalus: Secondary | ICD-10-CM

## 2018-02-07 DIAGNOSIS — Z794 Long term (current) use of insulin: Secondary | ICD-10-CM

## 2018-02-07 DIAGNOSIS — G936 Cerebral edema: Secondary | ICD-10-CM

## 2018-02-07 DIAGNOSIS — E1159 Type 2 diabetes mellitus with other circulatory complications: Secondary | ICD-10-CM

## 2018-02-07 DIAGNOSIS — Z9289 Personal history of other medical treatment: Secondary | ICD-10-CM

## 2018-02-07 DIAGNOSIS — R509 Fever, unspecified: Secondary | ICD-10-CM

## 2018-02-07 DIAGNOSIS — D72829 Elevated white blood cell count, unspecified: Secondary | ICD-10-CM

## 2018-02-07 HISTORY — PX: CRANIECTOMY: SHX331

## 2018-02-07 LAB — POCT I-STAT 7, (LYTES, BLD GAS, ICA,H+H)
Acid-base deficit: 1 mmol/L (ref 0.0–2.0)
Bicarbonate: 23.2 mmol/L (ref 20.0–28.0)
Calcium, Ion: 1.22 mmol/L (ref 1.15–1.40)
HCT: 41 % (ref 39.0–52.0)
Hemoglobin: 13.9 g/dL (ref 13.0–17.0)
O2 Saturation: 98 %
Patient temperature: 98.6
Potassium: 3.6 mmol/L (ref 3.5–5.1)
Sodium: 149 mmol/L — ABNORMAL HIGH (ref 135–145)
TCO2: 24 mmol/L (ref 22–32)
pCO2 arterial: 37 mmHg (ref 32.0–48.0)
pH, Arterial: 7.406 (ref 7.350–7.450)
pO2, Arterial: 99 mmHg (ref 83.0–108.0)

## 2018-02-07 LAB — CBC
HEMATOCRIT: 44.4 % (ref 39.0–52.0)
Hemoglobin: 14 g/dL (ref 13.0–17.0)
MCH: 28.2 pg (ref 26.0–34.0)
MCHC: 31.5 g/dL (ref 30.0–36.0)
MCV: 89.5 fL (ref 80.0–100.0)
PLATELETS: 193 10*3/uL (ref 150–400)
RBC: 4.96 MIL/uL (ref 4.22–5.81)
RDW: 13.2 % (ref 11.5–15.5)
WBC: 19.3 10*3/uL — ABNORMAL HIGH (ref 4.0–10.5)
nRBC: 0 % (ref 0.0–0.2)

## 2018-02-07 LAB — MAGNESIUM
Magnesium: 2.1 mg/dL (ref 1.7–2.4)
Magnesium: 2.2 mg/dL (ref 1.7–2.4)

## 2018-02-07 LAB — SURGICAL PCR SCREEN
MRSA, PCR: NEGATIVE
Staphylococcus aureus: NEGATIVE

## 2018-02-07 LAB — ABO/RH: ABO/RH(D): A POS

## 2018-02-07 LAB — HEMOGLOBIN A1C
Hgb A1c MFr Bld: 8.1 % — ABNORMAL HIGH (ref 4.8–5.6)
Mean Plasma Glucose: 185.77 mg/dL

## 2018-02-07 LAB — SODIUM
Sodium: 146 mmol/L — ABNORMAL HIGH (ref 135–145)
Sodium: 148 mmol/L — ABNORMAL HIGH (ref 135–145)
Sodium: 149 mmol/L — ABNORMAL HIGH (ref 135–145)

## 2018-02-07 LAB — GLUCOSE, CAPILLARY
GLUCOSE-CAPILLARY: 224 mg/dL — AB (ref 70–99)
Glucose-Capillary: 104 mg/dL — ABNORMAL HIGH (ref 70–99)
Glucose-Capillary: 129 mg/dL — ABNORMAL HIGH (ref 70–99)
Glucose-Capillary: 129 mg/dL — ABNORMAL HIGH (ref 70–99)
Glucose-Capillary: 134 mg/dL — ABNORMAL HIGH (ref 70–99)
Glucose-Capillary: 160 mg/dL — ABNORMAL HIGH (ref 70–99)
Glucose-Capillary: 179 mg/dL — ABNORMAL HIGH (ref 70–99)
Glucose-Capillary: 200 mg/dL — ABNORMAL HIGH (ref 70–99)
Glucose-Capillary: 231 mg/dL — ABNORMAL HIGH (ref 70–99)
Glucose-Capillary: 79 mg/dL (ref 70–99)

## 2018-02-07 LAB — BASIC METABOLIC PANEL
Anion gap: 7 (ref 5–15)
BUN: 29 mg/dL — ABNORMAL HIGH (ref 8–23)
CO2: 22 mmol/L (ref 22–32)
Calcium: 8.4 mg/dL — ABNORMAL LOW (ref 8.9–10.3)
Chloride: 119 mmol/L — ABNORMAL HIGH (ref 98–111)
Creatinine, Ser: 1.53 mg/dL — ABNORMAL HIGH (ref 0.61–1.24)
GFR calc Af Amer: 56 mL/min — ABNORMAL LOW (ref >60)
GFR, EST NON AFRICAN AMERICAN: 48 mL/min — AB (ref >60)
GLUCOSE: 171 mg/dL — AB (ref 70–99)
Potassium: 3.7 mmol/L (ref 3.5–5.1)
Sodium: 148 mmol/L — ABNORMAL HIGH (ref 135–145)

## 2018-02-07 LAB — TYPE AND SCREEN
ABO/RH(D): A POS
Antibody Screen: NEGATIVE

## 2018-02-07 LAB — PHOSPHORUS
Phosphorus: 2.2 mg/dL — ABNORMAL LOW (ref 2.5–4.6)
Phosphorus: 1.7 mg/dL — ABNORMAL LOW (ref 2.5–4.6)

## 2018-02-07 SURGERY — CRANIECTOMY POSTERIOR FOSSA DECOMPRESSION
Anesthesia: General | Site: Head

## 2018-02-07 MED ORDER — SODIUM CHLORIDE 0.9% FLUSH
10.0000 mL | Freq: Two times a day (BID) | INTRAVENOUS | Status: DC
  Administered 2018-02-07 – 2018-02-08 (×2): 10 mL
  Administered 2018-02-08: 30 mL
  Administered 2018-02-09: 10 mL

## 2018-02-07 MED ORDER — PROPOFOL 10 MG/ML IV BOLUS
INTRAVENOUS | Status: AC
  Filled 2018-02-07: qty 20

## 2018-02-07 MED ORDER — FENTANYL CITRATE (PF) 250 MCG/5ML IJ SOLN
INTRAMUSCULAR | Status: AC
  Filled 2018-02-07: qty 5

## 2018-02-07 MED ORDER — THROMBIN 5000 UNITS EX SOLR
OROMUCOSAL | Status: DC | PRN
  Administered 2018-02-07: 5 mL via TOPICAL

## 2018-02-07 MED ORDER — INSULIN ASPART 100 UNIT/ML ~~LOC~~ SOLN
0.0000 [IU] | SUBCUTANEOUS | Status: DC
  Administered 2018-02-07: 5 [IU] via SUBCUTANEOUS
  Administered 2018-02-07: 2 [IU] via SUBCUTANEOUS
  Administered 2018-02-07: 3 [IU] via SUBCUTANEOUS
  Administered 2018-02-08: 5 [IU] via SUBCUTANEOUS
  Administered 2018-02-08: 3 [IU] via SUBCUTANEOUS
  Administered 2018-02-08: 8 [IU] via SUBCUTANEOUS
  Administered 2018-02-08: 3 [IU] via SUBCUTANEOUS
  Administered 2018-02-08 (×2): 5 [IU] via SUBCUTANEOUS
  Administered 2018-02-09: 2 [IU] via SUBCUTANEOUS
  Administered 2018-02-09: 3 [IU] via SUBCUTANEOUS

## 2018-02-07 MED ORDER — CHLORHEXIDINE GLUCONATE CLOTH 2 % EX PADS
6.0000 | MEDICATED_PAD | Freq: Every day | CUTANEOUS | Status: DC
  Administered 2018-02-08: 6 via TOPICAL

## 2018-02-07 MED ORDER — INSULIN ASPART 100 UNIT/ML ~~LOC~~ SOLN
0.0000 [IU] | SUBCUTANEOUS | Status: DC
  Administered 2018-02-07: 3 [IU] via SUBCUTANEOUS
  Administered 2018-02-07: 5 [IU] via SUBCUTANEOUS

## 2018-02-07 MED ORDER — BUPIVACAINE HCL (PF) 0.5 % IJ SOLN
INTRAMUSCULAR | Status: AC
  Filled 2018-02-07: qty 30

## 2018-02-07 MED ORDER — THROMBIN 5000 UNITS EX SOLR
CUTANEOUS | Status: AC
  Filled 2018-02-07: qty 5000

## 2018-02-07 MED ORDER — BACITRACIN ZINC 500 UNIT/GM EX OINT
TOPICAL_OINTMENT | CUTANEOUS | Status: DC | PRN
  Administered 2018-02-07: 1 via TOPICAL

## 2018-02-07 MED ORDER — ROCURONIUM BROMIDE 10 MG/ML (PF) SYRINGE
PREFILLED_SYRINGE | INTRAVENOUS | Status: DC | PRN
  Administered 2018-02-07: 50 mg via INTRAVENOUS

## 2018-02-07 MED ORDER — SODIUM CHLORIDE 0.9 % IV SOLN
INTRAVENOUS | Status: DC | PRN
  Administered 2018-02-07: 40 ug/min via INTRAVENOUS

## 2018-02-07 MED ORDER — FENTANYL CITRATE (PF) 100 MCG/2ML IJ SOLN
INTRAMUSCULAR | Status: DC | PRN
  Administered 2018-02-07 (×2): 100 ug via INTRAVENOUS
  Administered 2018-02-07: 50 ug via INTRAVENOUS

## 2018-02-07 MED ORDER — ONDANSETRON HCL 4 MG/2ML IJ SOLN
INTRAMUSCULAR | Status: AC
  Filled 2018-02-07: qty 2

## 2018-02-07 MED ORDER — SODIUM CHLORIDE 0.9% FLUSH: 10.0000 mL | INTRAVENOUS | Status: DC | PRN

## 2018-02-07 MED ORDER — 0.9 % SODIUM CHLORIDE (POUR BTL) OPTIME
TOPICAL | Status: DC | PRN
  Administered 2018-02-07 (×2): 1000 mL

## 2018-02-07 MED ORDER — ASPIRIN EC 325 MG PO TBEC
325.0000 mg | DELAYED_RELEASE_TABLET | Freq: Every day | ORAL | Status: DC
  Administered 2018-02-08 – 2018-02-09 (×2): 325 mg via ORAL
  Filled 2018-02-07 (×2): qty 1

## 2018-02-07 MED ORDER — SODIUM CHLORIDE 3 % IV SOLN
INTRAVENOUS | Status: DC
  Administered 2018-02-07 – 2018-02-08 (×4): 75 mL/h via INTRAVENOUS
  Filled 2018-02-07 (×9): qty 500

## 2018-02-07 MED ORDER — LIDOCAINE-EPINEPHRINE 1 %-1:100000 IJ SOLN
INTRAMUSCULAR | Status: AC
  Filled 2018-02-07: qty 1

## 2018-02-07 MED ORDER — THROMBIN 20000 UNITS EX SOLR
CUTANEOUS | Status: DC | PRN
  Administered 2018-02-07: 20 mL via TOPICAL

## 2018-02-07 MED ORDER — SODIUM CHLORIDE 0.9 % IV SOLN
INTRAVENOUS | Status: DC | PRN
  Administered 2018-02-07 (×3): via INTRAVENOUS

## 2018-02-07 MED ORDER — PANTOPRAZOLE SODIUM 40 MG IV SOLR
40.0000 mg | INTRAVENOUS | Status: DC
  Administered 2018-02-07 – 2018-02-08 (×2): 40 mg via INTRAVENOUS
  Filled 2018-02-07 (×2): qty 40

## 2018-02-07 MED ORDER — INSULIN ASPART 100 UNIT/ML ~~LOC~~ SOLN
4.0000 [IU] | SUBCUTANEOUS | Status: DC
  Administered 2018-02-07 (×3): 4 [IU] via SUBCUTANEOUS

## 2018-02-07 MED ORDER — SODIUM CHLORIDE 0.9 % IV SOLN
3.0000 g | Freq: Three times a day (TID) | INTRAVENOUS | Status: DC
  Administered 2018-02-07 – 2018-02-09 (×6): 3 g via INTRAVENOUS
  Filled 2018-02-07 (×7): qty 3

## 2018-02-07 MED ORDER — PHENYLEPHRINE 40 MCG/ML (10ML) SYRINGE FOR IV PUSH (FOR BLOOD PRESSURE SUPPORT)
PREFILLED_SYRINGE | INTRAVENOUS | Status: DC | PRN
  Administered 2018-02-07 (×5): 80 ug via INTRAVENOUS

## 2018-02-07 MED ORDER — VITAL AF 1.2 CAL PO LIQD
1000.0000 mL | ORAL | Status: DC
  Administered 2018-02-09: 1000 mL

## 2018-02-07 MED ORDER — THROMBIN 20000 UNITS EX SOLR
CUTANEOUS | Status: AC
  Filled 2018-02-07: qty 20000

## 2018-02-07 MED ORDER — LIDOCAINE-EPINEPHRINE 1 %-1:100000 IJ SOLN
INTRAMUSCULAR | Status: DC | PRN
  Administered 2018-02-07: 3 mL via INTRADERMAL

## 2018-02-07 MED ORDER — ONDANSETRON HCL 4 MG/2ML IJ SOLN
INTRAMUSCULAR | Status: DC | PRN
  Administered 2018-02-07: 4 mg via INTRAVENOUS

## 2018-02-07 MED ORDER — PRAVASTATIN SODIUM 10 MG PO TABS
20.0000 mg | ORAL_TABLET | Freq: Every day | ORAL | Status: DC
  Administered 2018-02-08 – 2018-02-09 (×2): 20 mg via ORAL
  Filled 2018-02-07 (×2): qty 2

## 2018-02-07 MED ORDER — ROCURONIUM BROMIDE 50 MG/5ML IV SOSY
PREFILLED_SYRINGE | INTRAVENOUS | Status: AC
  Filled 2018-02-07: qty 5

## 2018-02-07 MED ORDER — PROPOFOL 10 MG/ML IV BOLUS
INTRAVENOUS | Status: DC | PRN
  Administered 2018-02-07: 20 mg via INTRAVENOUS

## 2018-02-07 MED ORDER — BUPIVACAINE HCL 0.5 % IJ SOLN
INTRAMUSCULAR | Status: DC | PRN
  Administered 2018-02-07: 3 mL

## 2018-02-07 MED ORDER — BACITRACIN ZINC 500 UNIT/GM EX OINT
TOPICAL_OINTMENT | CUTANEOUS | Status: AC
  Filled 2018-02-07: qty 28.35

## 2018-02-07 SURGICAL SUPPLY — 84 items
ADH SKN CLS APL DERMABOND .7 (GAUZE/BANDAGES/DRESSINGS) ×1
BLADE CLIPPER SURG (BLADE) IMPLANT
BLADE SURG 15 STRL LF DISP TIS (BLADE) IMPLANT
BLADE SURG 15 STRL SS (BLADE)
BLADE ULTRA TIP 2M (BLADE) IMPLANT
BUR ACORN 6.0 PRECISION (BURR) ×2 IMPLANT
BUR ACORN 6.0MM PRECISION (BURR) ×1
BUR ROUND FLUTED 4 SOFT TCH (BURR) ×1 IMPLANT
BUR ROUND FLUTED 4MM SOFT TCH (BURR) ×1
CABLE BIPOLOR RESECTION CORD (MISCELLANEOUS) ×3 IMPLANT
CANISTER SUCT 3000ML PPV (MISCELLANEOUS) ×3 IMPLANT
CARTRIDGE OIL MAESTRO DRILL (MISCELLANEOUS) ×1 IMPLANT
CATH VENTRIC 35X38 W/TROCAR LG (CATHETERS) IMPLANT
CLIP VESOCCLUDE MED 6/CT (CLIP) IMPLANT
COVER BACK TABLE 60X90IN (DRAPES) IMPLANT
COVER WAND RF STERILE (DRAPES) ×3 IMPLANT
DECANTER SPIKE VIAL GLASS SM (MISCELLANEOUS) ×3 IMPLANT
DERMABOND ADVANCED (GAUZE/BANDAGES/DRESSINGS) ×2
DERMABOND ADVANCED .7 DNX12 (GAUZE/BANDAGES/DRESSINGS) IMPLANT
DIFFUSER DRILL AIR PNEUMATIC (MISCELLANEOUS) ×3 IMPLANT
DRAPE LAPAROTOMY 100X72 PEDS (DRAPES) ×3 IMPLANT
DRAPE MICROSCOPE LEICA (MISCELLANEOUS) ×3 IMPLANT
DRAPE NEUROLOGICAL W/INCISE (DRAPES) IMPLANT
DRAPE WARM FLUID 44X44 (DRAPE) ×3 IMPLANT
DRSG OPSITE POSTOP 4X6 (GAUZE/BANDAGES/DRESSINGS) ×2 IMPLANT
DURAPREP 26ML APPLICATOR (WOUND CARE) ×3 IMPLANT
ELECT CAUTERY BLADE 6.4 (BLADE) ×3 IMPLANT
ELECT REM PT RETURN 9FT ADLT (ELECTROSURGICAL) ×3
ELECTRODE REM PT RTRN 9FT ADLT (ELECTROSURGICAL) ×1 IMPLANT
EVACUATOR 1/8 PVC DRAIN (DRAIN) IMPLANT
EVACUATOR SILICONE 100CC (DRAIN) IMPLANT
GAUZE 4X4 16PLY RFD (DISPOSABLE) IMPLANT
GAUZE SPONGE 4X4 12PLY STRL (GAUZE/BANDAGES/DRESSINGS) IMPLANT
GLOVE BIO SURGEON STRL SZ7 (GLOVE) ×2 IMPLANT
GLOVE BIOGEL PI IND STRL 7.0 (GLOVE) IMPLANT
GLOVE BIOGEL PI IND STRL 7.5 (GLOVE) ×1 IMPLANT
GLOVE BIOGEL PI INDICATOR 7.0 (GLOVE)
GLOVE BIOGEL PI INDICATOR 7.5 (GLOVE) ×4
GLOVE ECLIPSE 7.0 STRL STRAW (GLOVE) ×6 IMPLANT
GLOVE EXAM NITRILE XL STR (GLOVE) IMPLANT
GOWN STRL REUS W/ TWL LRG LVL3 (GOWN DISPOSABLE) ×2 IMPLANT
GOWN STRL REUS W/ TWL XL LVL3 (GOWN DISPOSABLE) IMPLANT
GOWN STRL REUS W/TWL 2XL LVL3 (GOWN DISPOSABLE) IMPLANT
GOWN STRL REUS W/TWL LRG LVL3 (GOWN DISPOSABLE) ×6
GOWN STRL REUS W/TWL XL LVL3 (GOWN DISPOSABLE)
GRAFT DURAGEN MATRIX 3WX3L (Graft) ×6 IMPLANT
GRAFT DURAGEN MATRIX 3X3 SNGL (Graft) IMPLANT
HEMOSTAT SURGICEL 2X14 (HEMOSTASIS) ×3 IMPLANT
KIT BASIN OR (CUSTOM PROCEDURE TRAY) ×3 IMPLANT
KIT DRAIN CSF ACCUDRAIN (MISCELLANEOUS) IMPLANT
KIT TURNOVER KIT B (KITS) ×3 IMPLANT
MARKER SKIN DUAL TIP RULER LAB (MISCELLANEOUS) IMPLANT
NDL HYPO 25X1 1.5 SAFETY (NEEDLE) ×1 IMPLANT
NDL SPNL 18GX3.5 QUINCKE PK (NEEDLE) IMPLANT
NEEDLE HYPO 25X1 1.5 SAFETY (NEEDLE) ×3 IMPLANT
NEEDLE SPNL 18GX3.5 QUINCKE PK (NEEDLE) IMPLANT
NS IRRIG 1000ML POUR BTL (IV SOLUTION) ×3 IMPLANT
OIL CARTRIDGE MAESTRO DRILL (MISCELLANEOUS) ×3
PACK CRANIOTOMY CUSTOM (CUSTOM PROCEDURE TRAY) ×3 IMPLANT
PAD ARMBOARD 7.5X6 YLW CONV (MISCELLANEOUS) ×9 IMPLANT
PATTIES SURGICAL .5 X.5 (GAUZE/BANDAGES/DRESSINGS) IMPLANT
PATTIES SURGICAL .5 X3 (DISPOSABLE) IMPLANT
PATTIES SURGICAL 1/4 X 3 (GAUZE/BANDAGES/DRESSINGS) IMPLANT
PATTIES SURGICAL 1X1 (DISPOSABLE) IMPLANT
PIN MAYFIELD SKULL DISP (PIN) IMPLANT
SEALANT ADHERUS EXTEND TIP (MISCELLANEOUS) ×2 IMPLANT
SPONGE NEURO XRAY DETECT 1X3 (DISPOSABLE) IMPLANT
SPONGE SURGIFOAM ABS GEL 100 (HEMOSTASIS) IMPLANT
STAPLER SKIN PROX WIDE 3.9 (STAPLE) ×3 IMPLANT
SUT ETHILON 3 0 FSL (SUTURE) IMPLANT
SUT NURALON 4 0 TR CR/8 (SUTURE) ×6 IMPLANT
SUT PROLENE 6 0 BV (SUTURE) IMPLANT
SUT VIC AB 0 CT1 18XCR BRD8 (SUTURE) IMPLANT
SUT VIC AB 0 CT1 8-18 (SUTURE) ×9
SUT VIC AB 2-0 CT1 18 (SUTURE) ×3 IMPLANT
SUT VIC AB 3-0 SH 8-18 (SUTURE) ×3 IMPLANT
SUT VIC AB 4-0 RB1 18 (SUTURE) IMPLANT
SUT VICRYL 3-0 RB1 18 ABS (SUTURE) ×2 IMPLANT
SYR CONTROL 10ML LL (SYRINGE) ×3 IMPLANT
TOWEL GREEN STERILE (TOWEL DISPOSABLE) ×3 IMPLANT
TOWEL GREEN STERILE FF (TOWEL DISPOSABLE) IMPLANT
TRAY FOLEY MTR SLVR 16FR STAT (SET/KITS/TRAYS/PACK) IMPLANT
UNDERPAD 30X30 (UNDERPADS AND DIAPERS) IMPLANT
WATER STERILE IRR 1000ML POUR (IV SOLUTION) ×3 IMPLANT

## 2018-02-07 NOTE — Progress Notes (Addendum)
STROKE TEAM PROGRESS NOTE   SUBJECTIVE (INTERVAL HISTORY) His wife is at the bedside.  Pt had EVD yesterday and CT repeat today showed improved hydrocephalus but still has severe swelling at posterior fossa and diminished basal cistern. Pt still obtunded, not following commands. Discussed with wife and Dr. Kathyrn Sheriff and will proceed with suboccipital decompression. TF on hold now.   OBJECTIVE Vitals:   01/19/2018 0600 01/29/2018 0700 02/08/2018 0800 01/27/2018 0810  BP: (!) 143/90 139/90 (!) 148/94 (!) 148/94  Pulse: 99 (!) 102 (!) 104 (!) 105  Resp: 15 16 18 19   Temp:   (!) 101.1 F (38.4 C)   TempSrc:   Axillary   SpO2: 97% 97% 98% 98%  Weight:      Height:        CBC:  Recent Labs  Lab 01/14/2018 0754  01/29/2018 0358 01/15/2018 0500  WBC 17.0*  --   --  19.3*  NEUTROABS 14.8*  --   --   --   HGB 15.0   < > 13.9 14.0  HCT 47.6   < > 41.0 44.4  MCV 89.1  --   --  89.5  PLT 249  --   --  193   < > = values in this interval not displayed.    Basic Metabolic Panel:  Recent Labs  Lab 02/13/2018 0754  01/14/2018 0841  01/30/2018 1620  01/31/2018 0358 01/27/2018 0500  NA 140   < >  --    < >  --    < > 149* 148*  K 4.2   < >  --    < >  --   --  3.6 3.7  CL 103  --   --   --   --   --   --  119*  CO2 25  --   --   --   --   --   --  22  GLUCOSE 183*  --   --   --   --   --   --  171*  BUN 19  --   --   --   --   --   --  29*  CREATININE 1.50*  --  1.40*  --   --   --   --  1.53*  CALCIUM 9.0  --   --   --   --   --   --  8.4*  MG  --   --   --    < > 2.1  --   --  2.1  PHOS  --   --   --    < > 2.5  --   --  2.2*   < > = values in this interval not displayed.    Lipid Panel:     Component Value Date/Time   TRIG 93 02/10/2018 0842   HgbA1c:  Lab Results  Component Value Date   HGBA1C 8.1 (H) 01/20/2018   Urine Drug Screen:     Component Value Date/Time   LABOPIA NONE DETECTED 01/22/2018 1003   COCAINSCRNUR NONE DETECTED 01/20/2018 1003   LABBENZ POSITIVE (A) 01/20/2018 1003    AMPHETMU NONE DETECTED 02/07/2018 1003   THCU NONE DETECTED 02/11/2018 1003   LABBARB NONE DETECTED 02/08/2018 1003    Alcohol Level     Component Value Date/Time   ETH <10 01/30/2018 0754    IMAGING  Ct Angio Head W Or Wo Contrast Ct Angio Neck W Or  Wo Contrast 01/17/2018 IMPRESSION:   CTA neck:  1. Patent carotid and vertebral arteries of the neck. No large vessel occlusion, aneurysm, or significant stenosis by NASCET criteria is identified.   CTA head:  1. Long segment of near occlusion/severe stenosis of right PICA.  2. Severe right and mild left P2 stenosis.  3. No additional intracranial large vessel occlusion, aneurysm, or significant stenosis is identified.  4. Infarction of right greater than left cerebellar hemispheres is stable in distribution, however, associated edema and mass effect is increased. There is complete effacement of fourth ventricle and partial effacement of prepontine cistern. Additionally, there is early superior transtentorial herniation and cerebellar tonsillar herniation.  5. Interval placement of right frontal approach ventriculostomy catheter with tip in the third ventricle. Decreased size of lateral and third ventricles.    Ct Head Wo Contrast 01/19/2018 IMPRESSION:  1. Persistent edema within the right greater than left cerebellar hemispheres with continued effacement of basal cisterns.  2. Decreased size of the shunted lateral and third ventricles.  3. Unchanged mild cerebellar tonsillar herniation.  4. No acute hemorrhage.    Dg Chest Port 1 View 02/01/2018 IMPRESSION:  1. New opacity at the RIGHT lung base, atelectasis versus aspiration.  2. Endotracheal tube well positioned with tip just above the level of the carina.   Dg Chest Port 1 View 02/03/2018 IMPRESSION:  Left subclavian vein central venous catheter placed with its tip at the lower SVC and no pneumothorax. NG tube coiled in the stomach. Stable endotracheal tube. Clear  lungs.   Dg Chest Portable 1 View 02/02/2018 IMPRESSION:  Endotracheal tube as described without pneumothorax. No edema or consolidation. Stable cardiac silhouette.    Ct Head Code Stroke Wo Contrast 01/20/2018 IMPRESSION:  1. Acute/subacute infarct in the cerebellum bilaterally right greater than left. Extensive edema is causing moderate obstructive hydrocephalus.  2. Hyperdense basilar raises the possibility of thrombus however this could be blood pool effect.  3. ASPECTS is 10.    Transthoracic Echocardiogram  00/00/00 Pending    PHYSICAL EXAM  Temp:  [98.7 F (37.1 C)-103.1 F (39.5 C)] 101.1 F (38.4 C) (01/25 0800) Pulse Rate:  [88-112] 103 (01/25 1000) Resp:  [10-22] 18 (01/25 1000) BP: (75-155)/(61-99) 143/89 (01/25 1000) SpO2:  [96 %-100 %] 99 % (01/25 1000) Arterial Line BP: (95-187)/(58-149) 114/109 (01/25 0800) FiO2 (%):  [40 %-50 %] 40 % (01/25 0810) Weight:  [82.8 kg] 82.8 kg (01/25 0416)  General - Well nourished, well developed, intubated on sedation.  Ophthalmologic - fundi not visualized due to noncooperation.  Cardiovascular - Regular rate and rhythm.  Neuro - intubated on sedation, eyes closed but briefly open on strong pain stimulation with voice, not following commands. With forced eye opening, eyes in mid position, not blinking to visual threat, doll's eyes present, not tracking, PERRL. Corneal reflex present, gag and cough . Breathing over the vent.  Facial symmetry not able to test due to ET tube.  Tongue midline in mouth. On pain stimulation, mild withdraw in all extremities, seems symmetrical bilaterally. DTR 1+ and no babinski. Sensation, coordination and gait not tested.    ASSESSMENT/PLAN Jared Vazquez is a 63 y.o. male with history of DM, Htn and gout presenting with dizziness, N&V, unsteady gait -> unresponsiveness for which he was intubated. He did not receive IV t-PA due to late presentation.  Stroke: Bilateral cerebellar infarcts,  R>L - embolic - source unknown  Resultant  Obtunded   CT head - Acute/subacute infarct in  the cerebellum bilaterally right greater than left. Extensive edema is causing moderate obstructive hydrocephalus.    CTA H&N - Long segment of near occlusion/severe stenosis of right PICA. Severe right and mild left P2 stenosis.   2D Echo - pending  LDL - pending  HgbA1c - 8.1  UDS - Benzodiazepines   VTE prophylaxis - SCDs  Diet - NPO - tube feedings  No antithrombotic prior to admission, now on No antithrombotic  Patient will be counseled to be compliant with his antithrombotic medications  Ongoing aggressive stroke risk factor management  Therapy recommendations:  pending  Disposition:  Pending  Obstructive hydrocephalus  CT showed severe obstructive hydrocephalus  S/p EVD  Repeat CT much improved hydrocephalus   Patent drainage  Cerebral edema  Repeat CT showed b/l cerebellar edema with IV ventricle effacement   Diminished basal cistern with tonsillar herniation   NSG Dr. Kathyrn Sheriff consulted  Will do suboccipital decompression today   Wife agreed   Fever with leukocytosis  Tmax 103.1-101.1  Leukocytosis 17.0->19.3  UA negative  BCx pending  Sputum culture pending  On unasyn   CCM on board  Hypertension  Stable - off Cleviprex . Permissive hypertension (OK if < 180/105) but gradually normalize in 5-7 days . Long-term BP goal normotensive  Hyperlipidemia  Lipid lowering medication PTA:  Pravachol 20 mg daily  LDL - pending, goal < 70  Current lipid lowering medication: pravachol 20  Continue statin at discharge  Diabetes  HgbA1c 8.1, goal < 7.0  Uncontrolled  Hyperglycemia improving  SSI  CBG monitoring  Other Stroke Risk Factors  Advanced age  Other Active Problems  CKD stage III - Cre. 1.50->1.40->1.53  Hospital day # 1  This patient is critically ill due to bilateral cerebellar infarcts, hydrocephalus s/p EVD,  posterior fossa edema, fever, HTN hyperglycemai and at significant risk of neurological worsening, death form recurrent stroke, hemorrhagic conversion, brainstem compression, brain herniation, seizure, DKA, heart failure, sepsis. This patient's care requires constant monitoring of vital signs, hemodynamics, respiratory and cardiac monitoring, review of multiple databases, neurological assessment, discussion with family, other specialists and medical decision making of high complexity. I spent 45 minutes of neurocritical care time in the care of this patient. I had long discussion with wife at bedside, updated pt current condition, treatment plan and potential prognosis. She expressed understanding and appreciation. I also discussed with Dr. Kathyrn Sheriff.  Rosalin Hawking, MD PhD Stroke Neurology 01/21/2018 10:47 PM  To contact Stroke Continuity provider, please refer to [email or web address] After hours, contact General Neurology  ADDENDUM:  ASA was not started on 01/26/2018 as pt was going to suboccipital decompression. In order to minimized perioperative bleeding, will hold off ASA for today after discussion with Dr. Kathyrn Sheriff. If clinically stable after surgery, may consider to start ASA on 02/08/18.   Rosalin Hawking, MD PhD Stroke Neurology 03/15/2018 3:53 PM

## 2018-02-07 NOTE — Anesthesia Postprocedure Evaluation (Signed)
Anesthesia Post Note  Patient: ADEN SEK  Procedure(s) Performed: CRANIECTOMY POSTERIOR FOSSA DECOMPRESSION (N/A Head)     Patient location during evaluation: SICU Anesthesia Type: General Level of consciousness: sedated Pain management: pain level controlled Vital Signs Assessment: post-procedure vital signs reviewed and stable Respiratory status: patient remains intubated per anesthesia plan Cardiovascular status: stable Postop Assessment: no apparent nausea or vomiting Anesthetic complications: no    Last Vitals:  Vitals:   01/18/2018 1600 01/27/2018 2000  BP: (!) 156/90 123/81  Pulse: 92 87  Resp: 14 11  Temp:  36.6 C  SpO2: 98% 92%    Last Pain:  Vitals:   01/26/2018 2000  TempSrc: Axillary  PainSc:                  Catalina Gravel

## 2018-02-07 NOTE — Progress Notes (Signed)
  NEUROSURGERY PROGRESS NOTE   No issues overnight, has developed fever.  EXAM:  BP (!) 147/89   Pulse 97   Temp (!) 101.1 F (38.4 C) (Axillary) Comment: Nurse notified  Resp 16   Ht 6' (1.829 m)   Wt 82.8 kg   SpO2 99%   BMI 24.76 kg/m   Opens eyes to pain Pupils 82mm, reactive bilaterally (+) doll's eyes Breathing spontaneously over vent W/d BUE/BLE, but not FC  IMAGING: Repeat CTH this am demonstrates good position of EVD with decompression of the ventricular system. There has been progression of edema in the posterior fossa now with some compression of the myelencephalon and rhombencephalon. 4th ventricle remains completely effaced. Also seen is early tonsillar herniation.  IMPRESSION:  63 y.o. male s/p large cerebellar stroke with some improvement in neurologic condition after ventricular drainage but continues to be somnolent with radiographic progression of edema and posterior fossa mass effect.   PLAN: - Will proceed today with surgical posterior fossa decompression - Cont hypertonic saline treatment  I have reviewed the situation with Dr. Erlinda Hong (neurology). We both agree that given only the marginal clinical improvement with CSF diversion and progression of mass effect on imaging that surgical decompression would be reasonable. I have discussed this with the patient's wife at bedside. Risks of surgery including bleeding, infection, heart attack, further stroke, and DVT/PE were reviewed. She appeared to understand our discussion and does wish to proceed with surgery. All her questions were answered and she provided consent.

## 2018-02-07 NOTE — Progress Notes (Addendum)
NAME:  Jared Vazquez, MRN:  [id number], DOB:  06-27-1955, LOS: 1 ADMISSION DATE:  02/13/2018, CONSULTATION DATE:  01/15/2018 REFERRING MD:  EDP - Plunkett, CHIEF COMPLAINT:  Ischemic CVA   Brief History   63 year old male diabetic and hypertensive male who had vertigo on 1/22 and presented to [facility] where he was discharged but unsure what the work-up was.  On 1/24 he was found unresponsive at home and EMS was called.  Patient was brought to [facility] where he was found to have a large left posterior circulation stroke with hydrocephalus and was intubated for airway protection and transferred to the ICU for IVD to be placed.  PCCM was asked to admit for critical care management.  No family bedside, no further history available beyond records.  History of present illness   63 year old male diabetic and hypertensive male who had vertigo on 1/22 and presented to [facility] where he was discharged but unsure what the work-up was.  On 1/24 he was found unresponsive at home and EMS was called.  Patient was brought to [facility] where he was found to have a large left posterior circulation stroke with hydrocephalus and was intubated for airway protection and transferred to the ICU for IVD to be placed.  PCCM was asked to admit for critical care management.  No family bedside, no further history available beyond records.  Past Medical History  HTN DM  Significant Hospital Events   1/24 IVD placed, intubated and admitted to the ICU  Consults:  PCCM Neuro NS  Procedures:  IVD on the R 1/24>>>  Significant Diagnostic Tests:  Head CT 1/24>>> that I personally reviewed with left PCA CVA CT Head 1/25>> personally reviewed:  Persistent edema within the right greater than left cerebellar hemispheres with continued effacement of basal cisterns. Decreased size of the shunted lateral and third ventricles. Unchanged mild cerebellar tonsillar herniation. Micro Data:  N/A  Antimicrobials:  N/A    Interim history/subjective:  New fever T Max 103.1 New opacity right lung base, aspiration vs atelectasis Weaning on 10/5  Objective   Blood pressure (!) 148/94, pulse (!) 105, temperature (!) 101.1 F (38.4 C), temperature source Axillary, resp. rate 19, height 6' (1.829 m), weight 82.8 kg, SpO2 98 %.    Vent Mode: CPAP;PSV FiO2 (%):  [40 %-50 %] 40 % Set Rate:  [10 bmp] 10 bmp Vt Set:  [600 mL] 600 mL PEEP:  [5 cmH20] 5 cmH20 Pressure Support:  [10 cmH20] 10 cmH20 Plateau Pressure:  [19 cmH20-23 cmH20] 19 cmH20   Intake/Output Summary (Last 24 hours) at 02/12/2018 0838 Last data filed at 01/15/2018 0800 Gross per 24 hour  Intake 1221.87 ml  Output 1177 ml  Net 44.87 ml   Filed Weights   02/10/2018 0852 02/06/2018 0416  Weight: 89.3 kg 82.8 kg    Examination: General: Acutely ill appearing male, NAD,weaning 10/5 HENT: [place]/AT, PERRL, EOM-I and MMM, OG Lungs: Bilateral chest excursion, CTA bilaterally, diminished per R base Cardiovascular: S1, S2, RRR,and -M/R/G Abdomen: Soft, NT, ND and +BS Extremities: -edema and -tenderness, no obvious deformities Neuro: Follows a few commands weakly per nursing staff,+  withdraw to pain , off all sedation Skin: Warm , dry and intact, no rash or lesions  Resolved Hospital Problem list   N/A  Assessment & Plan:  63 year old male with ischemic CVA who presents to PCCM with respiratory failure, AMS, hydrocephalus and hypertension.      VDRF: Currently on  40% weaning 10/5 New R infiltrate per CXR 1/25 Plan  - Continue SBT as tolerated  - Titrate  FiO2 and PEEP as able             - ABG prn  - Sat goal of 88-92%  Fever with new R infiltrate vs atelectasis per CXR 1/25 T max 103.1 Leukocytosis>> 19.3 UA negative for nitrites Plan:             - Blood Cx x 2             - Sputum Culture             - Unasyn              - Trend fever curve/ WBC             - Tylenol for fever > 101.5  Renal/ AKI Creatinine 1.53  Plan                - Trend BMET daily               - Replete electrolytes as needed               - Avoid nephrotoxic medications  HTN:  - Neurology to manage             - SBP goal < 160             - Cleviprex to maintain goal>> currently not required   DM: CBG's are > 150 Plan:  - SSI  - CBG Q 4             - Will add TF coverage of 4 Units Q 4  CVA: IVD averaging 9-15 cc per hour Plan:  - IVD per NS  - 3% per neuro  - BP management per neuro    FEN:  - TF per nutrition  - SCD's  - Defer anticoagulation to neurology  Discussion Pt is weaning well on 10/5 on  1/25. He has a new fever of 103.1 with Right  infiltrate vs atelectasis per CXR. His neuro event was high risk aspiration. Will initiate Unasyn. Cultures ordered.    Labs   CBC: Recent Labs  Lab 02/05/2018 0754 01/17/2018 0836 01/20/2018 0949 02/05/2018 0358 01/26/2018 0500  WBC 17.0*  --   --   --  19.3*  NEUTROABS 14.8*  --   --   --   --   HGB 15.0 15.6 14.3 13.9 14.0  HCT 47.6 46.0 42.0 41.0 44.4  MCV 89.1  --   --   --  89.5  PLT 249  --   --   --  197    Basic Metabolic Panel: Recent Labs  Lab 02/01/2018 0754 01/18/2018 0836 01/26/2018 0841 01/18/2018 0842 01/28/2018 0949 02/11/2018 1442 02/05/2018 1620 01/21/2018 2042 01/27/2018 0242 01/20/2018 0358 01/31/2018 0500  NA 140 135  --  140 139 139  --  140 146* 149* 148*  K 4.2 6.8*  --   --  3.6  --   --   --   --  3.6 3.7  CL 103  --   --   --   --   --   --   --   --   --  119*  CO2 25  --   --   --   --   --   --   --   --   --  22  GLUCOSE 183*  --   --   --   --   --   --   --   --   --  171*  BUN 19  --   --   --   --   --   --   --   --   --  29*  CREATININE 1.50*  --  1.40*  --   --   --   --   --   --   --  1.53*  CALCIUM 9.0  --   --   --   --   --   --   --   --   --  8.4*  MG  --   --   --  1.9  --   --  2.1  --   --   --  2.1  PHOS  --   --   --  2.6  --   --  2.5  --   --   --  2.2*   GFR: Estimated Creatinine Clearance: 54.9 mL/min (A) (by C-G formula  based on SCr of 1.53 mg/dL (H)). Recent Labs  Lab 01/18/2018 0754 01/23/2018 0500  WBC 17.0* 19.3*    Liver Function Tests: Recent Labs  Lab 02/05/2018 0754  AST 40  ALT 14  ALKPHOS 56  BILITOT 1.1  PROT 7.3  ALBUMIN 3.5   No results for input(s): LIPASE, AMYLASE in the last 168 hours. No results for input(s): AMMONIA in the last 168 hours.  ABG    Component Value Date/Time   PHART 7.406 01/19/2018 0358   PCO2ART 37.0 01/25/2018 0358   PO2ART 99.0 01/30/2018 0358   HCO3 23.2 02/03/2018 0358   TCO2 24 02/04/2018 0358   ACIDBASEDEF 1.0 02/08/2018 0358   O2SAT 98.0 02/03/2018 0358     Coagulation Profile: Recent Labs  Lab 01/23/2018 0754  INR 1.15    Cardiac Enzymes: No results for input(s): CKTOTAL, CKMB, CKMBINDEX, TROPONINI in the last 168 hours.  HbA1C: No results found for: HGBA1C  CBG: Recent Labs  Lab 02/11/2018 0101 02/10/2018 0145 01/17/2018 0259 01/22/2018 0359 01/21/2018 0826  GLUCAP 129* 134* 160* 179* 224*    Review of Systems:   Unattainable  Past Medical History  He,  has a past medical history of Diabetes mellitus without complication ([place]), Gout, and Hypertension.   Surgical History   History reviewed. No pertinent surgical history.   Social History   reports that he has never smoked. He has never used smokeless tobacco. He reports current alcohol use. He reports that he does not use drugs.   Family History   His family history includes High blood pressure in his father and mother.   Allergies No Known Allergies   Home Medications  Prior to Admission medications   Medication Sig Start Date End Date Taking? Authorizing Provider  metFORMIN (GLUCOPHAGE) 500 MG tablet Take 500 mg by mouth daily with breakfast.    [provider]  naproxen (NAPROSYN) 500 MG tablet Take 1 tablet (500 mg total) by mouth 2 (two) times daily with a meal. 09/26/16   Sanjuana Kava, MD  nebivolol (BYSTOLIC) 10 MG tablet Take 10 mg by mouth daily.    [provider]  TRIBENZOR 40-10-25 MG TABS Take 1 tablet by mouth daily. 11/21/14   [provider]      Magdalen Spatz, MSN, AGACNP-BC. [place]. Pager: [phone] After hours pager: [phone]. 02/02/2018 9:22 AM

## 2018-02-07 NOTE — Transfer of Care (Signed)
Immediate Anesthesia Transfer of Care Note  Patient: Jared Vazquez  Procedure(s) Performed: CRANIECTOMY POSTERIOR FOSSA DECOMPRESSION (N/A Head)  Patient Location: ICU  Anesthesia Type:General  Level of Consciousness: drowsy and Patient remains intubated per anesthesia plan.  Patient with respiratory effort.  Airway & Oxygen Therapy: Patient remains intubated per anesthesia plan and Patient placed on Ventilator (see vital sign flow sheet for setting)  Post-op Assessment: Report given to RN and Post -op Vital signs reviewed and stable  Post vital signs: Reviewed and stable  Last Vitals:  Vitals Value Taken Time  BP    Temp    Pulse 90 01/17/2018  7:21 PM  Resp 11 01/29/2018  7:15 PM  SpO2 94 % 02/05/2018  7:21 PM  Vitals shown include unvalidated device data.  Last Pain:  Vitals:   01/25/2018 1200  TempSrc: Axillary  PainSc:          Complications: No apparent anesthesia complications

## 2018-02-07 NOTE — Progress Notes (Signed)
Pt's 3% order was automatically d/c'ed, pt has been off of 3% for 6 hours. Confirmed with Dr.Xu, resume 3% at 2ml/hr via central line per order.

## 2018-02-07 NOTE — Op Note (Signed)
  NEUROSURGERY OPERATIVE NOTE   PREOP DIAGNOSIS:  1.  Intracranial hypertension 2.  Cerebellar stroke  POSTOP DIAGNOSIS: Same  PROCEDURE: 1. Suboccipital craniectomy for posterior fossa decompression 2. Expansile Duraplasty  SURGEON: Dr. Consuella Lose, MD  ASSISTANT: Ferne Reus, PA-C  ANESTHESIA: General Endotracheal  EBL: 200cc  SPECIMENS: None  DRAINS: None  COMPLICATIONS: None immediate  CONDITION: Hemodynamically stable to ICU  HISTORY: Jared Vazquez is a 63 y.o. male initially seen in consultation in the emergency department after presenting with severe obtundation requiring intubation.  CT scan demonstrated a relatively large right greater than left cerebellar stroke.  Initially, external ventricular drain was placed to treat his hydrocephalus.  Follow-up exam demonstrated marginal improvement but continued evidence of brainstem compression.  Repeat CT scan also did demonstrate significant posterior fossa mass-effect.  In consultation with the neurology service as well as the patient's wife, he did appear to be a candidate for surgical decompression.  The risks and benefits of the surgery were reviewed in detail with the patient's wife. After all questions were answered, informed consent was obtained.  PROCEDURE IN DETAIL: After informed consent was obtained and witnessed, the patient was brought to the operating room. After induction of general anesthesia, the [place] head holder was applied to the patient, and he was positioned on the operative table in the prone position. All pressure points were then meticulously padded. The skin of the occiput was then clipped prepped and draped in the usual sterile fashion.  After timeout was conducted, skin incision was infiltrated with local and aesthetic with epinephrine. Skin incision was then made sharply, and Bovie electrocautery was used to dissect through subcutaneous tissue until the nuchal fascia was identified. This  was then incised in a Y-shaped fashion based superiorly. The nuchal musculature was then divided in the avascular midline plane, and the occipital bone, foramen magnum, and lamina of C1 were identified and dissected in the subperiosteal plane. Self-retaining retractor was then placed.  Utilizing a high-speed drill, suboccipital craniectomy was completed, and extended laterally until the entire width of the foramen magnum was opened, and the lateral edge of the foramen magnum was palpable.  The posterior fossa dura was opened in a Y-shaped fashion along the cerebellar tonsils, and in the midline down to just above C1.  The right cerebellar hemisphere did appear to be dusky, with minimal bleeding.  Hemostasis was secured with bipolar electrocautery and morselized Gelfoam and thrombin.  Having completed decompression, I placed a collagen dural inlay graft and tucked it underneath the dural leaflets.  The edges of the dura were then laid over the inlay graft, and another piece of collagen dural substitute was placed as an onlay graft.  Polyethylene glycol spray was then used as a dural sealant.  At this point the wound is irrigated with copious amounts of normal saline irrigation. Good hemostasis was confirmed. The wound was then closed in multiple layers using a combination of 0 and 3-0 by stitches. Skin was then closed with Dermabond. The patient was then transferred to the stretcher, the [place] head holder removed, and taken to the neuro intensive care unit in stable hemodynamic condition.  At the end of the case all sponge, needle, cottonoid, and instrument counts were correct.

## 2018-02-07 NOTE — Anesthesia Preprocedure Evaluation (Signed)
Anesthesia Evaluation  Patient identified by MRN, date of birth, ID band Patient awake and Patient unresponsive  General Assessment Comment:62 y.o. male s/p large cerebellar stroke with some improvement in neurologic condition after ventricular drainage but continues to be somnolent with radiographic progression of edema and posterior fossa mass effect.  Reviewed: Allergy & Precautions, NPO status , Patient's Chart, lab work & pertinent test results, reviewed documented beta blocker date and time   Airway Mallampati: Intubated  TM Distance: >3 FB Neck ROM: Full    Dental  (+) Teeth Intact, Dental Advisory Given   Pulmonary neg pulmonary ROS,    breath sounds clear to auscultation   + intubated    Cardiovascular hypertension, Pt. on home beta blockers Normal cardiovascular exam Rhythm:Regular Rate:Normal     Neuro/Psych negative neurological ROS     GI/Hepatic negative GI ROS, Neg liver ROS,   Endo/Other  negative endocrine ROSdiabetes, Type 2, Oral Hypoglycemic Agents  Renal/GU Renal disease     Musculoskeletal negative musculoskeletal ROS (+)   Abdominal   Peds  Hematology negative hematology ROS (+)   Anesthesia Other Findings Day of surgery medications reviewed with the patient.  Reproductive/Obstetrics                             Anesthesia Physical Anesthesia Plan  ASA: IV  Anesthesia Plan: General   Post-op Pain Management:    Induction: Intravenous  PONV Risk Score and Plan: 2 and Treatment may vary due to age or medical condition  Airway Management Planned: Oral ETT  Additional Equipment: Arterial line and CVP  Intra-op Plan:   Post-operative Plan: Post-operative intubation/ventilation  Informed Consent:     Only emergency history available and History available from chart only  Plan Discussed with: CRNA  Anesthesia Plan Comments:         Anesthesia Quick  Evaluation

## 2018-02-08 ENCOUNTER — Inpatient Hospital Stay (HOSPITAL_COMMUNITY): Payer: 59

## 2018-02-08 DIAGNOSIS — I639 Cerebral infarction, unspecified: Secondary | ICD-10-CM

## 2018-02-08 DIAGNOSIS — E785 Hyperlipidemia, unspecified: Secondary | ICD-10-CM

## 2018-02-08 DIAGNOSIS — G935 Compression of brain: Secondary | ICD-10-CM

## 2018-02-08 DIAGNOSIS — G918 Other hydrocephalus: Secondary | ICD-10-CM

## 2018-02-08 DIAGNOSIS — R739 Hyperglycemia, unspecified: Secondary | ICD-10-CM

## 2018-02-08 LAB — GLUCOSE, CAPILLARY
Glucose-Capillary: 173 mg/dL — ABNORMAL HIGH (ref 70–99)
Glucose-Capillary: 188 mg/dL — ABNORMAL HIGH (ref 70–99)
Glucose-Capillary: 214 mg/dL — ABNORMAL HIGH (ref 70–99)
Glucose-Capillary: 230 mg/dL — ABNORMAL HIGH (ref 70–99)
Glucose-Capillary: 239 mg/dL — ABNORMAL HIGH (ref 70–99)
Glucose-Capillary: 265 mg/dL — ABNORMAL HIGH (ref 70–99)

## 2018-02-08 LAB — ECHOCARDIOGRAM COMPLETE
Height: 72 in
Weight: 2944 oz

## 2018-02-08 LAB — BASIC METABOLIC PANEL
Anion gap: 7 (ref 5–15)
BUN: 25 mg/dL — ABNORMAL HIGH (ref 8–23)
CO2: 22 mmol/L (ref 22–32)
Calcium: 8.5 mg/dL — ABNORMAL LOW (ref 8.9–10.3)
Chloride: 127 mmol/L — ABNORMAL HIGH (ref 98–111)
Creatinine, Ser: 1.49 mg/dL — ABNORMAL HIGH (ref 0.61–1.24)
GFR calc Af Amer: 57 mL/min — ABNORMAL LOW (ref >60)
GFR calc non Af Amer: 50 mL/min — ABNORMAL LOW (ref >60)
Glucose, Bld: 261 mg/dL — ABNORMAL HIGH (ref 70–99)
Potassium: 3.7 mmol/L (ref 3.5–5.1)
Sodium: 156 mmol/L — ABNORMAL HIGH (ref 135–145)

## 2018-02-08 LAB — CBC
HCT: 42.6 % (ref 39.0–52.0)
HEMOGLOBIN: 13.6 g/dL (ref 13.0–17.0)
MCH: 29.1 pg (ref 26.0–34.0)
MCHC: 31.9 g/dL (ref 30.0–36.0)
MCV: 91 fL (ref 80.0–100.0)
Platelets: 159 10*3/uL (ref 150–400)
RBC: 4.68 MIL/uL (ref 4.22–5.81)
RDW: 13.7 % (ref 11.5–15.5)
WBC: 20.3 10*3/uL — ABNORMAL HIGH (ref 4.0–10.5)
nRBC: 0 % (ref 0.0–0.2)

## 2018-02-08 LAB — SODIUM
SODIUM: 155 mmol/L — AB (ref 135–145)
Sodium: 158 mmol/L — ABNORMAL HIGH (ref 135–145)
Sodium: 161 mmol/L (ref 135–145)

## 2018-02-08 LAB — LIPID PANEL
Cholesterol: 128 mg/dL (ref 0–200)
HDL: 30 mg/dL — ABNORMAL LOW (ref >40)
LDL Cholesterol: 68 mg/dL (ref 0–99)
Total CHOL/HDL Ratio: 4.3 RATIO
Triglycerides: 152 mg/dL — ABNORMAL HIGH (ref <150)
VLDL: 30 mg/dL (ref 0–40)

## 2018-02-08 LAB — PROCALCITONIN: Procalcitonin: 2.34 ng/mL

## 2018-02-08 LAB — MAGNESIUM: Magnesium: 2.2 mg/dL (ref 1.7–2.4)

## 2018-02-08 MED ORDER — INSULIN ASPART 100 UNIT/ML ~~LOC~~ SOLN: 2.0000 [IU] | SUBCUTANEOUS | Status: DC

## 2018-02-08 MED ORDER — INSULIN ASPART 100 UNIT/ML ~~LOC~~ SOLN
3.0000 [IU] | SUBCUTANEOUS | Status: DC
  Administered 2018-02-08 – 2018-02-09 (×6): 3 [IU] via SUBCUTANEOUS

## 2018-02-08 MED ORDER — LABETALOL HCL 5 MG/ML IV SOLN: 10.0000 mg | INTRAVENOUS | Status: DC | PRN

## 2018-02-08 MED ORDER — PANTOPRAZOLE SODIUM 40 MG PO PACK
40.0000 mg | PACK | Freq: Every day | ORAL | Status: DC
  Administered 2018-02-09: 40 mg
  Filled 2018-02-08: qty 20

## 2018-02-08 MED ORDER — POTASSIUM CHLORIDE 20 MEQ/15ML (10%) PO SOLN
40.0000 meq | Freq: Once | ORAL | Status: AC
  Administered 2018-02-08: 40 meq via ORAL
  Filled 2018-02-08: qty 30

## 2018-02-08 MED ORDER — PANTOPRAZOLE SODIUM 40 MG PO TBEC
40.0000 mg | DELAYED_RELEASE_TABLET | Freq: Every day | ORAL | Status: DC
  Administered 2018-02-08: 40 mg via ORAL
  Filled 2018-02-08: qty 1

## 2018-02-08 MED ORDER — INSULIN GLARGINE 100 UNIT/ML ~~LOC~~ SOLN
15.0000 [IU] | Freq: Every day | SUBCUTANEOUS | Status: DC
  Administered 2018-02-08: 15 [IU] via SUBCUTANEOUS
  Filled 2018-02-08 (×2): qty 0.15

## 2018-02-08 MED ORDER — INSULIN ASPART 100 UNIT/ML ~~LOC~~ SOLN: 4.0000 [IU] | SUBCUTANEOUS | Status: DC

## 2018-02-08 NOTE — Progress Notes (Signed)
  Echocardiogram 2D Echocardiogram has been performed.  Bobbye Charleston 02/08/2018, 4:04 PM

## 2018-02-08 NOTE — Progress Notes (Signed)
NAME:  Jared Vazquez, MRN:  [id number], DOB:  [birth year], LOS: 2 ADMISSION DATE:  02/13/2018, CONSULTATION DATE:  02/10/2018 REFERRING MD:  EDP - Plunkett, CHIEF COMPLAINT:  Ischemic CVA   Brief History   63 year old male diabetic and hypertensive male who had vertigo on 1/22 and presented to [facility] where he was discharged but unsure what the work-up was.  On 1/24 he was found unresponsive at home and EMS was called.  Patient was brought to [facility] where he was found to have a large left posterior circulation stroke with hydrocephalus and was intubated for airway protection and transferred to the ICU for IVD to be placed.  PCCM was asked to admit for critical care management.  No family bedside, no further history available beyond records.  History of present illness   63 year old male diabetic and hypertensive male who had vertigo on 1/22 and presented to [facility] where he was discharged but unsure what the work-up was.  On 1/24 he was found unresponsive at home and EMS was called.  Patient was brought to [facility] where he was found to have a large left posterior circulation stroke with hydrocephalus and was intubated for airway protection and transferred to the ICU for IVD to be placed.  PCCM was asked to admit for critical care management.  No family bedside, no further history available beyond records.  Past Medical History  HTN DM  Significant Hospital Events   1/24 IVD placed, intubated and admitted to the ICU  Consults:  PCCM Neuro NS  Procedures:  IVD on the R 1/24>>>  Significant Diagnostic Tests:  Head CT 1/24>>> that I personally reviewed with left PCA CVA CT Head 1/25>> personally reviewed:  Persistent edema within the right greater than left cerebellar hemispheres with continued effacement of basal cisterns. Decreased size of the shunted lateral and third ventricles. Unchanged mild cerebellar tonsillar herniation. Micro Data:  1/26>> Sputum Culture>> GS Few GP  cocci  Antimicrobials:  Unasyn 1/25>>  Interim history/subjective:   T Max 101.1 Leukocytosis Opacity right lung base, aspiration vs atelectasis, no change in imaging last 24 hours Weaning on 10/5  Objective   Blood pressure (!) 153/98, pulse 89, temperature 98.1 F (36.7 C), temperature source Axillary, resp. rate 12, height 6' (1.829 m), weight 83.8 kg, SpO2 95 %.    Vent Mode: PRVC FiO2 (%):  [40 %] 40 % Set Rate:  [10 bmp] 10 bmp Vt Set:  [600 mL] 600 mL PEEP:  [5 cmH20] 5 cmH20 Pressure Support:  [10 cmH20] 10 cmH20 Plateau Pressure:  [16 cmH20-20 cmH20] 20 cmH20   Intake/Output Summary (Last 24 hours) at 02/08/2018 0950 Last data filed at 02/08/2018 0800 Gross per 24 hour  Intake 1131.01 ml  Output 2123 ml  Net -991.99 ml   Filed Weights   01/14/2018 0852 01/18/2018 0416 02/08/18 0500  Weight: 89.3 kg 82.8 kg 83.8 kg    Examination: General: Acutely ill appearing male, NAD,weaning 10/5 HENT: [place]/AT, PERRL, EOM-I and MMM, OG Lungs: Bilateral chest excursion, Rhonchi  bilaterally, diminished per R base Cardiovascular: S1, S2, RRR,and -M/R/G Abdomen: Soft, NT, ND and +BS, tolerating TF Extremities: -edema and -tenderness, no obvious deformities Neuro: Follows a few commands weakly per nursing staff,+  withdraw to pain , off all sedation, weakly moves extremities Skin: Warm , dry and intact, no rash or lesions  Resolved Hospital Problem list   N/A  Assessment & Plan:  63 year old male with ischemic CVA  who presents to PCCM with respiratory failure, AMS, hydrocephalus and hypertension.      VDRF: Currently on 40% weaning 10/5 New R infiltrate per CXR 1/25 Plan  - Continue SBT as tolerated  - Titrate  FiO2 and PEEP as able             - ABG prn  - Sat goal of 88-92%  Fever with no change in  R infiltrate vs atelectasis per CXR 1/26 T max 101.1 Leukocytosis>> 20.3 PCT 1/26>> 2.34 UA negative for nitrites Plan:             - Follow micro Blood Cx x 2              - Follow micro Sputum Culture             - Continue Unasyn >> de-escalate as able per sensitivities when available             - Trend fever curve/ WBC             - Tylenol for fever > 101.5  Renal/ AKI Creatinine 1.49  Plan               - Trend BMET daily               - Replete electrolytes as needed               - Avoid nephrotoxic medications  HTN:  - Neurology to manage             - SBP goal < 160             - Cleviprex to maintain goal>> currently not required   DM: CBG's are > 150 Plan:  - SSI  - CBG Q 4             - Will add TF coverage of 4 Units Q 4  CVA: IVD averaging 212 cc last 24  hour Plan:  - IVD per NS  - 3% per neuro  - BP management per neuro    FEN:  - TF per nutrition  - SCD's  - Defer anticoagulation to neurology  Discussion Pt is weaning well on 10/5 on  1/25. He has a continued  fever of 101.1 Imaging remains unchanged Right pleural effusion with overlying opacification of the right base. Continue Unasyn, follow micro for sensitivities, Tylenol for fever > 101.5 Continue SBT trials  Labs   CBC: Recent Labs  Lab 01/21/2018 0754 01/28/2018 0836 01/15/2018 0949 02/13/2018 0358 01/17/2018 0500 02/08/18 0500  WBC 17.0*  --   --   --  19.3* 20.3*  NEUTROABS 14.8*  --   --   --   --   --   HGB 15.0 15.6 14.3 13.9 14.0 13.6  HCT 47.6 46.0 42.0 41.0 44.4 42.6  MCV 89.1  --   --   --  89.5 91.0  PLT 249  --   --   --  193 481    Basic Metabolic Panel: Recent Labs  Lab 01/16/2018 0754 01/28/2018 0836 01/19/2018 0841 01/30/2018 0842 01/14/2018 0949  01/19/2018 1620  02/02/2018 0358 01/29/2018 0500 02/02/2018 0934 01/30/2018 1200 02/13/2018 1611 02/08/18 0242 02/08/18 0500  NA 140 135  --  140 139   < >  --    < > 149* 148* 148* 149*  --  155* 156*  K 4.2 6.8*  --   --  3.6  --   --   --  3.6 3.7  --   --   --   --  3.7  CL 103  --   --   --   --   --   --   --   --  119*  --   --   --   --  127*  CO2 25  --   --   --   --   --   --   --   --  22   --   --   --   --  22  GLUCOSE 183*  --   --   --   --   --   --   --   --  171*  --   --   --   --  261*  BUN 19  --   --   --   --   --   --   --   --  29*  --   --   --   --  25*  CREATININE 1.50*  --  1.40*  --   --   --   --   --   --  1.53*  --   --   --   --  1.49*  CALCIUM 9.0  --   --   --   --   --   --   --   --  8.4*  --   --   --   --  8.5*  MG  --   --   --  1.9  --   --  2.1  --   --  2.1  --   --  2.2  --  2.2  PHOS  --   --   --  2.6  --   --  2.5  --   --  2.2*  --   --  1.7*  --   --    < > = values in this interval not displayed.   GFR: Estimated Creatinine Clearance: 56.4 mL/min (A) (by C-G formula based on SCr of 1.49 mg/dL (H)). Recent Labs  Lab 01/26/2018 0754 02/02/2018 0500 02/08/18 0500  PROCALCITON  --   --  2.34  WBC 17.0* 19.3* 20.3*    Liver Function Tests: Recent Labs  Lab 01/26/2018 0754  AST 40  ALT 14  ALKPHOS 56  BILITOT 1.1  PROT 7.3  ALBUMIN 3.5   No results for input(s): LIPASE, AMYLASE in the last 168 hours. No results for input(s): AMMONIA in the last 168 hours.  ABG    Component Value Date/Time   PHART 7.406 02/12/2018 0358   PCO2ART 37.0 01/25/2018 0358   PO2ART 99.0 01/22/2018 0358   HCO3 23.2 02/05/2018 0358   TCO2 24 01/25/2018 0358   ACIDBASEDEF 1.0 01/30/2018 0358   O2SAT 98.0 02/03/2018 0358     Coagulation Profile: Recent Labs  Lab 02/11/2018 0754  INR 1.15    Cardiac Enzymes: No results for input(s): CKTOTAL, CKMB, CKMBINDEX, TROPONINI in the last 168 hours.  HbA1C: Hgb A1c MFr Bld  Date/Time Value Ref Range Status  02/10/2018 09:34 AM 8.1 (H) 4.8 - 5.6 % Final    Comment:    (NOTE) Pre diabetes:          5.7%-6.4% Diabetes:              >6.4% Glycemic control for   <7.0% adults with diabetes     CBG: Recent Labs  Lab 02/03/2018 1603 01/20/2018 1933 01/18/2018 2315 02/08/18 0321 02/08/18 0811  GLUCAP 129* 79 200* 239* 230*    Review of Systems:   Unattainable  Past Medical History  He,  has a past  medical history of Diabetes mellitus without complication ([place]), Gout, and Hypertension.   Surgical History   History reviewed. No pertinent surgical history.   Social History   reports that he has never smoked. He has never used smokeless tobacco. He reports current alcohol use. He reports that he does not use drugs.   Family History   His family history includes High blood pressure in his father and mother.   Allergies No Known Allergies   Home Medications  Prior to Admission medications   Medication Sig Start Date End Date Taking? Authorizing Provider  metFORMIN (GLUCOPHAGE) 500 MG tablet Take 500 mg by mouth daily with breakfast.    [provider]  naproxen (NAPROSYN) 500 MG tablet Take 1 tablet (500 mg total) by mouth 2 (two) times daily with a meal. 09/26/16   Sanjuana Kava, MD  nebivolol (BYSTOLIC) 10 MG tablet Take 10 mg by mouth daily.    [provider]  TRIBENZOR 40-10-25 MG TABS Take 1 tablet by mouth daily. 11/21/14   [provider]      Magdalen Spatz, MSN, AGACNP-BC. [place]. Pager: [phone] After hours pager: [phone]. 02/08/2018 9:50 AM

## 2018-02-08 NOTE — Progress Notes (Signed)
  NEUROSURGERY PROGRESS NOTE   No issues overnight.   EXAM:  BP (!) 153/98   Pulse 89   Temp 98.1 F (36.7 C) (Axillary)   Resp 12   Ht 6' (1.829 m)   Wt 83.8 kg   SpO2 95%   BMI 25.06 kg/m   Opens eyes to pain Breathing spontaneously Pupils reactive W/D extremities, not FC Wound c/d/i, no fluid leakage EVD in place, clear CSF, 212cc x 24hrs  IMPRESSION:  63 y.o. male s/p cerebellar stroke POD#1 pos fossa decompression, neurologically stable - Likely pneumonia  PLAN: - Cont supportive care per neurology/PCCM - Would cont hypertonic saline - Will cont EVD open to drain for now.

## 2018-02-08 NOTE — Progress Notes (Signed)
CRITICAL VALUE ALERT  Critical Value:  Na 161  Date & Time Notied:  2015  Provider Notified: Dr. Lorraine Lax  Orders Received/Actions taken: Stop Na, continue Q6 Na checks.    Will continue to monitor.

## 2018-02-08 NOTE — Progress Notes (Signed)
STROKE TEAM PROGRESS NOTE   SUBJECTIVE (INTERVAL HISTORY) His wife is at the bedside.  Pt still intubated, had suboccipital decompression yesterday with Dr. Kathyrn Sheriff.  Today patient barely open eyes for pain, disconjugate eyes with bilateral abduction deficit, not moving all extremities on pain.  OBJECTIVE Vitals:   02/08/18 1300 02/08/18 1306 02/08/18 1400 02/08/18 1500  BP: (!) 163/101  (!) 164/98 (!) 164/99  Pulse: 89  91 90  Resp: 16  16 14   Temp:  98.7 F (37.1 C)    TempSrc:  Axillary    SpO2: 99%  99% 99%  Weight:      Height:        CBC:  Recent Labs  Lab 01/15/2018 0754  01/27/2018 0500 02/08/18 0500  WBC 17.0*  --  19.3* 20.3*  NEUTROABS 14.8*  --   --   --   HGB 15.0   < > 14.0 13.6  HCT 47.6   < > 44.4 42.6  MCV 89.1  --  89.5 91.0  PLT 249  --  193 159   < > = values in this interval not displayed.    Basic Metabolic Panel:  Recent Labs  Lab 01/24/2018 0500  02/01/2018 1611  02/08/18 0500 02/08/18 1250  NA 148*   < >  --    < > 156* 158*  K 3.7  --   --   --  3.7  --   CL 119*  --   --   --  127*  --   CO2 22  --   --   --  22  --   GLUCOSE 171*  --   --   --  261*  --   BUN 29*  --   --   --  25*  --   CREATININE 1.53*  --   --   --  1.49*  --   CALCIUM 8.4*  --   --   --  8.5*  --   MG 2.1  --  2.2  --  2.2  --   PHOS 2.2*  --  1.7*  --   --   --    < > = values in this interval not displayed.    Lipid Panel:     Component Value Date/Time   CHOL 128 02/08/2018 0500   TRIG 152 (H) 02/08/2018 0500   HDL 30 (L) 02/08/2018 0500   CHOLHDL 4.3 02/08/2018 0500   VLDL 30 02/08/2018 0500   LDLCALC 68 02/08/2018 0500   HgbA1c:  Lab Results  Component Value Date   HGBA1C 8.1 (H) 01/19/2018   Urine Drug Screen:     Component Value Date/Time   LABOPIA NONE DETECTED 01/16/2018 1003   COCAINSCRNUR NONE DETECTED 01/27/2018 1003   LABBENZ POSITIVE (A) 01/31/2018 1003   AMPHETMU NONE DETECTED 01/16/2018 1003   THCU NONE DETECTED 01/30/2018 1003    LABBARB NONE DETECTED 01/29/2018 1003    Alcohol Level     Component Value Date/Time   ETH <10 02/11/2018 0754    IMAGING  Ct Angio Head W Or Wo Contrast Ct Angio Neck W Or Wo Contrast 01/23/2018 IMPRESSION:   CTA neck:  1. Patent carotid and vertebral arteries of the neck. No large vessel occlusion, aneurysm, or significant stenosis by NASCET criteria is identified.   CTA head:  1. Long segment of near occlusion/severe stenosis of right PICA.  2. Severe right and mild left P2 stenosis.  3. No additional intracranial  large vessel occlusion, aneurysm, or significant stenosis is identified.  4. Infarction of right greater than left cerebellar hemispheres is stable in distribution, however, associated edema and mass effect is increased. There is complete effacement of fourth ventricle and partial effacement of prepontine cistern. Additionally, there is early superior transtentorial herniation and cerebellar tonsillar herniation.  5. Interval placement of right frontal approach ventriculostomy catheter with tip in the third ventricle. Decreased size of lateral and third ventricles.    Ct Head Wo Contrast 01/16/2018 IMPRESSION:  1. Persistent edema within the right greater than left cerebellar hemispheres with continued effacement of basal cisterns.  2. Decreased size of the shunted lateral and third ventricles.  3. Unchanged mild cerebellar tonsillar herniation.  4. No acute hemorrhage.    Dg Chest Port 1 View 02/10/2018 IMPRESSION:  1. New opacity at the RIGHT lung base, atelectasis versus aspiration.  2. Endotracheal tube well positioned with tip just above the level of the carina.   Dg Chest Port 1 View 01/26/2018 IMPRESSION:  Left subclavian vein central venous catheter placed with its tip at the lower SVC and no pneumothorax. NG tube coiled in the stomach. Stable endotracheal tube. Clear lungs.   Dg Chest Portable 1 View 02/07/2018 IMPRESSION:  Endotracheal tube as  described without pneumothorax. No edema or consolidation. Stable cardiac silhouette.    Ct Head Code Stroke Wo Contrast 01/15/2018 IMPRESSION:  1. Acute/subacute infarct in the cerebellum bilaterally right greater than left. Extensive edema is causing moderate obstructive hydrocephalus.  2. Hyperdense basilar raises the possibility of thrombus however this could be blood pool effect.  3. ASPECTS is 10.    Transthoracic Echocardiogram  02/08/2018 Study Conclusions  - Left ventricle: The cavity size was normal. Wall thickness was   increased in a pattern of moderate LVH. Systolic function was   normal. The estimated ejection fraction was in the range of 60%   to 65%. Diastolic function is abnormal, indeterminant grade. - Aorta: Aortic root dimension: 41 mm (ED). Ascending aortic   diameter: 40 mm (S). - Aortic root: The aortic root was mildly dilated. - Atrial septum: Cannot exclude small PFO. - Technically difficult study.  Mr Brain Wo Contrast  Result Date: 02/08/2018 CLINICAL DATA:  Stroke follow-up EXAM: MRI HEAD WITHOUT CONTRAST TECHNIQUE: Multiplanar, multiecho pulse sequences of the brain and surrounding structures were obtained without intravenous contrast. COMPARISON:  Head CT from yesterday FINDINGS: Brain: Acute cerebellar infarct with extensive edema and petechial hemorrhage. The fourth ventricle is completely effaced with EVD in place. Continued decrease in lateral ventricular volume with no residual ventriculomegaly. Mild FLAIR hyperintensity around the lateral ventricles that is decreasing. Mild edematous signal or fluid around the EVD catheter. Minimal blood products layering in the occipital horns of the lateral ventricles. There is still complete effacement of cisterns in the posterior fossa. Persistent brainstem distortion without infarct or brainstem hemorrhage. Trace subdural hygroma posterior to the right cerebellar hemisphere. No pseudomeningocele. Vascular: Major flow  voids are preserved Skull and upper cervical spine: Suboccipital craniectomy. Sinuses/Orbits: Intubation with layering fluid in the pharynx. Mild mucosal thickening in paranasal sinuses. IMPRESSION: 1. Acute cerebellar infarct requiring interval suboccipital craniectomy. Improved foramen magnum patency but still prominent brainstem mass effect. No brainstem hemorrhage or infarct. 2. Continued normalization of lateral ventricular volume and periventricular edema when compared to CT yesterday. Electronically Signed   By: Monte Fantasia M.D.   On: 02/08/2018 12:40    PHYSICAL EXAM  Temp:  [97.9 F (36.6 C)-98.9 F (37.2 C)] 98.7  F (37.1 C) (01/26 1306) Pulse Rate:  [87-104] 90 (01/26 1500) Resp:  [10-19] 14 (01/26 1500) BP: (123-164)/(81-101) 164/99 (01/26 1500) SpO2:  [92 %-99 %] 99 % (01/26 1500) Arterial Line BP: (119-144)/(90-117) 135/98 (01/26 0900) FiO2 (%):  [40 %] 40 % (01/26 1545) Weight:  [83.5 kg-83.8 kg] 83.5 kg (01/26 1152)  General - Well nourished, well developed, intubated on sedation.  Ophthalmologic - fundi not visualized due to noncooperation.  Cardiovascular - Regular rate and rhythm.  Neuro - intubated on sedation, eyes closed barely able to open on strong pain stimulation with voice, not following commands. With forced eye opening, eyes disconjugate bilateral abduction deficit, not blinking to visual threat, doll's eyes present, not tracking, PERRL. Corneal reflex absent, gag and cough absent. Facial symmetry not able to test due to ET tube.  Tongue midline in mouth. On pain stimulation, no movement in all extremities. DTR diminished and no babinski. Sensation, coordination and gait not tested.    ASSESSMENT/PLAN Mr. ROSE HIPPLER is a 63 y.o. male with history of DM, Htn and gout presenting with dizziness, N&V, unsteady gait -> unresponsiveness for which he was intubated. He did not receive IV t-PA due to late presentation.  Stroke: Bilateral cerebellar infarcts, R>L  - embolic - source unknown  Resultant  Obtunded   CT head - Acute/subacute infarct in the cerebellum bilaterally right greater than left. Extensive edema is causing moderate obstructive hydrocephalus.    CTA H&N - Long segment of near occlusion/severe stenosis of right PICA. Severe right and mild left P2 stenosis.   2D Echo  - EF 60 - 65%. Cannot exclude small PFO.  LDL - 68  HgbA1c - 8.1  UDS - Benzodiazepines   VTE prophylaxis - SCDs  Diet - NPO - tube feedings  No antithrombotic prior to admission, now on aspirin 325.  Patient will be counseled to be compliant with his antithrombotic medications  Ongoing aggressive stroke risk factor management  Therapy recommendations:  pending  Disposition:  Pending  Cerebral edema with brainstem compression  Repeat CT showed b/l cerebellar edema with IV ventricle effacement   Diminished basal cistern with tonsillar herniation   NSG Dr. Kathyrn Sheriff consulted  Status post suboccipital decompression 01/20/2018  Developed to disconjugate gaze, absent corneal and gag, no movement of all extremities.  MRI bilateral cerebellar swelling with brainstem compression and mass-effect, petechial hemorrhage throughout bilateral cerebellum  Na - 156->158->158  3% at 75 cc / hr - pharmacy monitoring  Obstructive hydrocephalus  CT showed severe obstructive hydrocephalus  S/p EVD with Dr. Kathyrn Sheriff  Repeat CT much improved hydrocephalus  MRI also shows stable ventricle size and improved hydrocephalus  Patent drainage, pressure set at 5 cm water  Fever with leukocytosis  Tmax 103.1-101.1 -> 98.7  Leukocytosis 17.0->19.3->20.3  UA negative  BCx pending  Sputum culture pending  On unasyn started 02/08/2018  CCM on board  Hypertension  Stable - off Cleviprex . Permissive hypertension (OK if < 180/105) but gradually normalize in 5-7 days . Long-term BP goal normotensive  Hyperlipidemia  Lipid lowering medication PTA:   Pravachol 20 mg daily  LDL - 68, goal < 70  Current lipid lowering medication: pravachol 20  Continue statin at discharge  Diabetes  HgbA1c 8.1, goal < 7.0  Uncontrolled  Hyperglycemia  Lantus 15 units  NovoLog 3 units every 4  SSI  CBG monitoring  Other Stroke Risk Factors  Advanced age  Other Active Problems  CKD stage III - Cre. 1.50->1.40->1.53->1.49  Hospital day # 2  This patient is critically ill due to bilateral cerebellar infarcts, hydrocephalus s/p EVD, posterior fossa edema, fever, HTN hyperglycemai and at significant risk of neurological worsening, death form recurrent stroke, hemorrhagic conversion, brainstem compression, brain herniation, seizure, DKA, heart failure, sepsis. This patient's care requires constant monitoring of vital signs, hemodynamics, respiratory and cardiac monitoring, review of multiple databases, neurological assessment, discussion with family, other specialists and medical decision making of high complexity. I spent 40 minutes of neurocritical care time in the care of this patient. I had long discussion with wife at bedside, updated pt current condition, treatment plan and potential prognosis. She expressed understanding and appreciation.   Rosalin Hawking, MD PhD Stroke Neurology 02/08/2018 6:22 PM  To contact Stroke Continuity provider, please refer to [email or web address] After hours, contact General Neurology

## 2018-02-09 ENCOUNTER — Encounter (HOSPITAL_COMMUNITY): Payer: Self-pay | Admitting: Neurosurgery

## 2018-02-09 ENCOUNTER — Inpatient Hospital Stay (HOSPITAL_COMMUNITY): Payer: 59

## 2018-02-09 DIAGNOSIS — Z7189 Other specified counseling: Secondary | ICD-10-CM

## 2018-02-09 DIAGNOSIS — Z515 Encounter for palliative care: Secondary | ICD-10-CM

## 2018-02-09 DIAGNOSIS — J9690 Respiratory failure, unspecified, unspecified whether with hypoxia or hypercapnia: Secondary | ICD-10-CM

## 2018-02-09 LAB — BASIC METABOLIC PANEL
BUN: 30 mg/dL — ABNORMAL HIGH (ref 8–23)
CO2: 23 mmol/L (ref 22–32)
Calcium: 8.4 mg/dL — ABNORMAL LOW (ref 8.9–10.3)
Chloride: >130 mmol/L (ref 98–111)
Creatinine, Ser: 1.75 mg/dL — ABNORMAL HIGH (ref 0.61–1.24)
GFR calc Af Amer: 47 mL/min — ABNORMAL LOW (ref >60)
GFR calc non Af Amer: 41 mL/min — ABNORMAL LOW (ref >60)
Glucose, Bld: 189 mg/dL — ABNORMAL HIGH (ref 70–99)
Potassium: 3.7 mmol/L (ref 3.5–5.1)
Sodium: 162 mmol/L (ref 135–145)

## 2018-02-09 LAB — CULTURE, RESPIRATORY W GRAM STAIN: Special Requests: NORMAL

## 2018-02-09 LAB — CBC
HCT: 41.9 % (ref 39.0–52.0)
Hemoglobin: 12.5 g/dL — ABNORMAL LOW (ref 13.0–17.0)
MCH: 27.8 pg (ref 26.0–34.0)
MCHC: 29.8 g/dL — ABNORMAL LOW (ref 30.0–36.0)
MCV: 93.1 fL (ref 80.0–100.0)
NRBC: 0 % (ref 0.0–0.2)
Platelets: 178 10*3/uL (ref 150–400)
RBC: 4.5 MIL/uL (ref 4.22–5.81)
RDW: 14.1 % (ref 11.5–15.5)
WBC: 21.4 10*3/uL — ABNORMAL HIGH (ref 4.0–10.5)

## 2018-02-09 LAB — GLUCOSE, CAPILLARY
GLUCOSE-CAPILLARY: 146 mg/dL — AB (ref 70–99)
Glucose-Capillary: 128 mg/dL — ABNORMAL HIGH (ref 70–99)

## 2018-02-09 LAB — SODIUM
Sodium: 162 mmol/L (ref 135–145)
Sodium: 164 mmol/L (ref 135–145)

## 2018-02-09 LAB — PROCALCITONIN: Procalcitonin: 2.01 ng/mL

## 2018-02-09 LAB — MAGNESIUM: MAGNESIUM: 2.3 mg/dL (ref 1.7–2.4)

## 2018-02-09 MED ORDER — DIPHENHYDRAMINE HCL 50 MG/ML IJ SOLN: 25.0000 mg | INTRAMUSCULAR | Status: DC | PRN

## 2018-02-09 MED ORDER — GLYCOPYRROLATE 1 MG PO TABS
1.0000 mg | ORAL_TABLET | ORAL | Status: DC | PRN
  Filled 2018-02-09: qty 1

## 2018-02-09 MED ORDER — MORPHINE BOLUS VIA INFUSION
2.0000 mg | INTRAVENOUS | Status: DC | PRN
  Filled 2018-02-09: qty 2

## 2018-02-09 MED ORDER — LORAZEPAM 2 MG/ML IJ SOLN
2.0000 mg | INTRAMUSCULAR | Status: DC | PRN
  Administered 2018-02-09: 2 mg via INTRAVENOUS
  Filled 2018-02-09: qty 1

## 2018-02-09 MED ORDER — GLYCOPYRROLATE 0.2 MG/ML IJ SOLN
0.2000 mg | INTRAMUSCULAR | Status: DC | PRN
  Administered 2018-02-09: 0.2 mg via INTRAVENOUS
  Filled 2018-02-09: qty 1

## 2018-02-09 MED ORDER — SODIUM CHLORIDE 0.9 % IV SOLN
2.0000 g | INTRAVENOUS | Status: DC
  Filled 2018-02-09: qty 20

## 2018-02-09 MED ORDER — MORPHINE BOLUS VIA INFUSION
4.0000 mg | INTRAVENOUS | Status: DC | PRN
  Filled 2018-02-09: qty 4

## 2018-02-09 MED ORDER — GLYCOPYRROLATE 0.2 MG/ML IJ SOLN: 0.2000 mg | INTRAMUSCULAR | Status: DC | PRN

## 2018-02-09 MED ORDER — MORPHINE 100MG IN NS 100ML (1MG/ML) PREMIX INFUSION
2.0000 mg/h | INTRAVENOUS | Status: DC
  Administered 2018-02-09: 2 mg/h via INTRAVENOUS
  Filled 2018-02-09: qty 100

## 2018-02-12 LAB — CULTURE, BLOOD (ROUTINE X 2)
Culture: NO GROWTH
Culture: NO GROWTH
Special Requests: ADEQUATE
Special Requests: ADEQUATE

## 2018-02-14 NOTE — Progress Notes (Signed)
NAME:  Jared Vazquez, MRN:  [id number], DOB:  1955-07-11, LOS: 3 ADMISSION DATE:  01/27/2018, CONSULTATION DATE:  1/24 REFERRING MD:  Maryan Rued, CHIEF COMPLAINT:  CVA   Brief History   63 y/o male admitted on 1/24 after being found unresponsive and discovered to have a large left posterior circulation stroke with hydrocephalus.    Past Medical History  HTN DM  Significant Hospital Events   1/24 intraventricular drain 1/25 poster fossa decompression  Consults:  PCCM Neurosurgery  Procedures:  IVD 1/24 >  ETT 1/25 >   Significant Diagnostic Tests:  Head CT 1/24>>> that I personally reviewed with left PCA CVA CT Head 1/25>> personally reviewed:  Persistent edema within the right greater than left cerebellar hemispheres with continued effacement of basal cisterns. Decreased size of the shunted lateral and third ventricles. Unchanged mild cerebellar tonsillar herniation. MRI Brain 1/26 > acute cerebellar infarct requiring suboccipital craniectomy, improved foramen magnum patency but still prominent brainstem mass effect, no brainstem hemorrhage, continued normalization of lateral ventricular volume and periventricular edema  Micro Data:  1/25 trach aspirate> H.flu 1/25 blood >    Antimicrobials:  1/25 Unasyn> 1/27 1/27 ceftriaxone>   Interim history/subjective:   Fever, WBC up No gag, no cough On SBT, breathing spontaneously  Objective   Blood pressure (!) 167/100, pulse (!) 105, temperature 100.2 F (37.9 C), temperature source Axillary, resp. rate 15, height 6' (1.829 m), weight 85 kg, SpO2 100 %.    Vent Mode: CPAP;PSV FiO2 (%):  [40 %] 40 % Set Rate:  [10 bmp] 10 bmp Vt Set:  [600 mL] 600 mL PEEP:  [5 cmH20] 5 cmH20 Pressure Support:  [10 cmH20] 10 cmH20 Plateau Pressure:  [16 cmH20-17 cmH20] 17 cmH20   Intake/Output Summary (Last 24 hours) at 02/16/18 8182 Last data filed at 2018/02/16 0700 Gross per 24 hour  Intake 1766.18 ml  Output 1511 ml  Net 255.18  ml   Filed Weights   01/14/2018 0416 02/08/18 0500 02/16/2018 0453  Weight: 82.8 kg 83.8 kg 85 kg    Examination:  General:  In bed on vent HENT: NCAT ETT in place PULM: CTA B, vent supported breathing CV: RRR, no mgr GI: BS+, soft, nontender MSK: normal bulk and tone Neuro: no eye opening, L eye deviated medially, no cough to suctioning, no gag    Resolved Hospital Problem list     Assessment & Plan:  Ischemic cerebellar stroke with significant mass effect, s/p decompressive craniotomy > continue hypertonic saline > no sedating meds > BP goals/order sets per neurology  Acute encephlopathy from brain swelling > consider EEG  Acute respiratory failure with hypoxemia  > continue pressure support > VAP prevention > no extubation unless mental status improves  H. Flu pneumonia > change unasyn to ceftriaxone  AKI:  > worse  Hypertension > BP goals per Neurology  Nutrition > continue tube feeding  Best practice:  Diet: continue tube feeding Pain/Anxiety/Delirium protocol (if indicated): PAD protocol VAP protocol (if indicated): yes DVT prophylaxis: SCD GI prophylaxis: Pantoprazole for stress ulcer prophylaxis Glucose control: SSI Mobility: bedrest Code Status: full Family Communication: updated wife bedside Disposition: remain in ICU  Labs   CBC: Recent Labs  Lab 01/28/2018 0754  01/19/2018 0949 01/30/2018 0358 01/26/2018 0500 02/08/18 0500 Feb 16, 2018 0500  WBC 17.0*  --   --   --  19.3* 20.3* 21.4*  NEUTROABS 14.8*  --   --   --   --   --   --  HGB 15.0   < > 14.3 13.9 14.0 13.6 12.5*  HCT 47.6   < > 42.0 41.0 44.4 42.6 41.9  MCV 89.1  --   --   --  89.5 91.0 93.1  PLT 249  --   --   --  193 159 178   < > = values in this interval not displayed.    Basic Metabolic Panel: Recent Labs  Lab 01/18/2018 0754  01/16/2018 0841  02/05/2018 0842 02/12/2018 0949  02/05/2018 1620  01/22/2018 0358 02/12/2018 0500  02/01/2018 1611  02/08/18 0500 02/08/18 1250  02/08/18 1745 03/09/18 0000 Mar 09, 2018 0500  NA 140   < >  --   --  140 139   < >  --    < > 149* 148*   < >  --    < > 156* 158* 161* 164* 162*  K 4.2   < >  --   --   --  3.6  --   --   --  3.6 3.7  --   --   --  3.7  --   --   --  3.7  CL 103  --   --   --   --   --   --   --   --   --  119*  --   --   --  127*  --   --   --  >130*  CO2 25  --   --   --   --   --   --   --   --   --  22  --   --   --  22  --   --   --  23  GLUCOSE 183*  --   --   --   --   --   --   --   --   --  171*  --   --   --  261*  --   --   --  189*  BUN 19  --   --   --   --   --   --   --   --   --  29*  --   --   --  25*  --   --   --  30*  CREATININE 1.50*  --  1.40*  --   --   --   --   --   --   --  1.53*  --   --   --  1.49*  --   --   --  1.75*  CALCIUM 9.0  --   --   --   --   --   --   --   --   --  8.4*  --   --   --  8.5*  --   --   --  8.4*  MG  --   --   --    < > 1.9  --   --  2.1  --   --  2.1  --  2.2  --  2.2  --   --   --  2.3  PHOS  --   --   --   --  2.6  --   --  2.5  --   --  2.2*  --  1.7*  --   --   --   --   --   --    < > =  values in this interval not displayed.   GFR: Estimated Creatinine Clearance: 48 mL/min (A) (by C-G formula based on SCr of 1.75 mg/dL (H)). Recent Labs  Lab 01/14/2018 0754 02/01/2018 0500 02/08/18 0500 02-22-18 0500  PROCALCITON  --   --  2.34 2.01  WBC 17.0* 19.3* 20.3* 21.4*    Liver Function Tests: Recent Labs  Lab 01/30/2018 0754  AST 40  ALT 14  ALKPHOS 56  BILITOT 1.1  PROT 7.3  ALBUMIN 3.5   No results for input(s): LIPASE, AMYLASE in the last 168 hours. No results for input(s): AMMONIA in the last 168 hours.  ABG    Component Value Date/Time   PHART 7.406 02/10/2018 0358   PCO2ART 37.0 01/19/2018 0358   PO2ART 99.0 02/13/2018 0358   HCO3 23.2 01/20/2018 0358   TCO2 24 01/28/2018 0358   ACIDBASEDEF 1.0 01/18/2018 0358   O2SAT 98.0 01/29/2018 0358     Coagulation Profile: Recent Labs  Lab 02/13/2018 0754  INR 1.15    Cardiac  Enzymes: No results for input(s): CKTOTAL, CKMB, CKMBINDEX, TROPONINI in the last 168 hours.  HbA1C: Hgb A1c MFr Bld  Date/Time Value Ref Range Status  02/04/2018 09:34 AM 8.1 (H) 4.8 - 5.6 % Final    Comment:    (NOTE) Pre diabetes:          5.7%-6.4% Diabetes:              >6.4% Glycemic control for   <7.0% adults with diabetes     CBG: Recent Labs  Lab 02/08/18 1607 02/08/18 1948 02/08/18 2341 February 22, 2018 0314 2018-02-22 0827  GLUCAP 214* 173* 188* 146* 128*     Critical care time: 35 minutes    Roselie Awkward, MD [place] [facility] Pager: [phone] Cell: [phone] If no response, call [phone]

## 2018-02-14 NOTE — Plan of Care (Signed)
Death certificate signed in the unit.   Rosalin Hawking, MD PhD Stroke Neurology 02-11-2018 5:02 PM

## 2018-02-14 NOTE — Progress Notes (Signed)
ET tube pulled back, per order, from 28 cm to 25 cm.  Measured @ lip/

## 2018-02-14 NOTE — Progress Notes (Signed)
Inpatient Diabetes Program Recommendations  AACE/ADA: New Consensus Statement on Inpatient Glycemic Control (2015)  Target Ranges:  Prepandial:   less than 140 mg/dL      Peak postprandial:   less than 180 mg/dL (1-2 hours)      Critically ill patients:  140 - 180 mg/dL   Lab Results  Component Value Date   GLUCAP 128 (H) 03/06/18   HGBA1C 8.1 (H) 02/02/2018    Review of Glycemic Control Results for ARSHAWN, VALDEZ (MRN [id number]) as of 06-Mar-2018 12:22  Ref. Range 02/08/2018 19:48 02/08/2018 23:41 March 06, 2018 03:14 03-06-18 08:27  Glucose-Capillary Latest Ref Range: 70 - 99 mg/dL 173 (H) 188 (H) 146 (H) 128 (H)   Diabetes history: Type 2 DM Outpatient Diabetes medications: Metformin 500 mg BID Current orders for Inpatient glycemic control: Lantus 15 units QHS, Novolog 3 units Q4H, Novolog 0-15 units Q4H  Inpatient Diabetes Program Recommendations:    Noted consult and updated orders. Current CBG trends look appropriate and agree with current orders. Will continue to follow.  Thanks, Bronson Curb, MSN, RNC-OB Diabetes Coordinator [phone] (8a-5p)

## 2018-02-14 NOTE — Progress Notes (Signed)
Visited with health care provider who shared the extubation had been done.  The family was there but most were leaving.  I met wife-and listened to her share the shock of he was ok just the other day and came home from cutting trees to say he wasn't feeling well.  They had not expected this to happen so fast.  I had prayer for peace and strength and to celebrate his life and what he meant to all of his family and friends. Conard Novak, Chaplain   02-15-2018 1700  Clinical Encounter Type  Visited With Family;Health care provider  Visit Type Initial;Spiritual support;Death  Referral From Nurse  Consult/Referral To Chaplain  Spiritual Encounters  Spiritual Needs Prayer  Stress Factors  Family Stress Factors Loss

## 2018-02-14 NOTE — Progress Notes (Signed)
CRITICAL VALUE ALERT  Critical Value:  Na 162, Cl >130  Date & Time Notied:  0645  Provider Notified: Dr. Lorraine Lax  Orders Received/Actions taken:  Continue to hold Na, continue Q6 Na checks

## 2018-02-14 NOTE — Progress Notes (Signed)
No charge note:   I remained at bedside as patient was extubated to room air. He tolerated procedure, requiring minimal boluses of morphine. Now comfortable on continuous morphine infusion. Anticipated hospital death within hours.  Mariana Kaufman, AGNP-C Palliative Medicine  Please call Palliative Medicine team phone with any questions [phone]. For individual providers please see AMION.

## 2018-02-14 NOTE — Death Summary Note (Signed)
DEATH SUMMARY   Patient Details  Name: Jared Vazquez MRN: [id number] DOB: 1955-07-16  Admission/Discharge Information   Admit Date:  03-07-18  Date of Death: Date of Death: 03/10/18  Time of Death: Time of Death: 05/07/1632  Length of Stay: 3  Referring Physician: Iona Beard, MD   Reason(s) for Hospitalization  Cerebellar infarct  Diagnoses  Preliminary cause of death:   Bilateral cerebellum large infarcts Cerebral edema Brainstem compression Obstructive hydrocephalus  Secondary Diagnoses (including complications and co-morbidities):  Acute respiratory failure with hypoxemia ([place]) Advanced care planning/counseling discussion Goals of care, counseling/discussion Hypertension Hyperlipidemia Diabetes Fever Leukocytosis CKD stage III   Brief Hospital Course (including significant findings, care, treatment, and services provided and events leading to death)  Jared Vazquez is a 63 y.o. male with history of DM, Htn and gout presenting with dizziness, N&V, unsteady gait -> unresponsiveness for which he was intubated. He did not receive IV t-PA due to late presentation.  Stroke: Bilateral cerebellar infarcts, R>L - embolic - source unknown  Resultant  Obtunded   CT head - Acute/subacute infarct in the cerebellum bilaterally right greater than left. Extensive edema is causing moderate obstructive hydrocephalus.    CTA H&N - Long segment of near occlusion/severe stenosis of right PICA. Severe right and mild left P2 stenosis.   2D Echo  - EF 60 - 65%. Cannot exclude small PFO.  LDL - 68  HgbA1c - 8.1  UDS - Benzodiazepines   VTE prophylaxis - SCDs  Deposition -given poor prognosis, palliative care services involved, after discussion with wife and the daughter, patient put on comfort care measures, patient passed away shortly after extubation.  Cerebral edema with brainstem compression  Repeat CT showed b/l cerebellar edema with IV ventricle effacement   Diminished  basal cistern with tonsillar herniation   NSG Dr. Kathyrn Sheriff consulted  Status post suboccipital decompression 01/26/2018  Developed to disconjugate gaze, absent corneal and gag, no movement of all extremities.  MRI bilateral cerebellar swelling with brainstem compression and mass-effect, petechial hemorrhage throughout bilateral cerebellum  Na - 156->158->158->162  wasn 3% saline  Obstructive hydrocephalus  CT showed severe obstructive hydrocephalus  S/p EVD with Dr. Kathyrn Sheriff  Repeat CT much improved hydrocephalus  MRI also shows stable ventricle size and improved hydrocephalus  Patent drainage, pressure set at 5 cm water  Fever with leukocytosis  Tmax 103.1-101.1 -> 98.7->100.5  Leukocytosis 17.0->19.3->20.3->21.4  UA negative  BCx pending  Sputum culture pending  On unasyn started 01/24/2018  Hypertension  Stable - off Cleviprex  Hyperlipidemia  Lipid lowering medication PTA:  Pravachol 20 mg daily  LDL - 68, goal < 70  lipid lowering medication: pravachol 20  Diabetes  HgbA1c 8.1, goal < 7.0  Uncontrolled  Hyperglycemia  Lantus 15 units  NovoLog 3 units every 4  SSI  Other Stroke Risk Factors  Advanced age  Other Active Problems  CKD stage III - Cre. 1.50->1.40->1.53->1.49->1.75   Pertinent Labs and Studies  Significant Diagnostic Studies Ct Angio Head W Or Wo Contrast  Result Date: 03-07-2018 CLINICAL DATA:  63 y/o M; probable right PICA stroke post decompression for hydrocephalus for follow-up. EXAM: CT ANGIOGRAPHY HEAD AND NECK TECHNIQUE: Multidetector CT imaging of the head and neck was performed using the standard protocol during bolus administration of intravenous contrast. Multiplanar CT image reconstructions and MIPs were obtained to evaluate the vascular anatomy. Carotid stenosis measurements (when applicable) are obtained utilizing NASCET criteria, using the distal internal carotid diameter as the denominator. CONTRAST:  132mL ISOVUE-370 IOPAMIDOL (ISOVUE-370) INJECTION 76% COMPARISON:  01/17/2018 CT head. FINDINGS: CTA NECK FINDINGS Aortic arch: Standard branching. Imaged portion shows no evidence of aneurysm or dissection. No significant stenosis of the major arch vessel origins. Right carotid system: No evidence of dissection, stenosis (50% or greater) or occlusion. Left carotid system: No evidence of dissection, stenosis (50% or greater) or occlusion. Vertebral arteries: Codominant. No evidence of dissection, stenosis (50% or greater) or occlusion. Skeleton: C2-C6 DISH. Interbody fusion of C5-C7 on degenerative basis. No high-grade bony spinal canal stenosis. Other neck: Endotracheal tube and enteric tube extending below the field of view into the thorax. Left central venous catheter is extending below the field of view into the thorax. Upper chest: Negative. Review of the MIP images confirms the above findings CTA HEAD FINDINGS Anterior circulation: No significant stenosis, proximal occlusion, aneurysm, or vascular malformation. Non stenotic calcific atherosclerosis of the carotid siphons. Posterior circulation: Long segment of near occlusion/severe stenosis of the right PICA (series 7 image 134-145). Severe right P2 stenosis. Mild left P2 stenosis. No additional large vessel occlusion, aneurysm, or significant stenosis is identified. Venous sinuses: As permitted by contrast timing, patent. Anatomic variants: Complete circle-of-Willis. Delayed phase: Interval placement of a right frontal approach ventriculostomy catheter with tip in the third ventricle traversing the right foramen of Monro. Decreased size of the lateral and third ventricles, for example the third ventricle measures 14 mm medial-lateral, previously 17 mm. Infarction of the right greater than left cerebellar hemispheres is stable in distribution, however, associated edema and mass effect is increased with complete effacement of fourth ventricle, partial  effacement of prepontine cistern, as well as early superior transtentorial herniation and cerebellar tonsillar herniation. Review of the MIP images confirms the above findings IMPRESSION: CTA neck: 1. Patent carotid and vertebral arteries of the neck. No large vessel occlusion, aneurysm, or significant stenosis by NASCET criteria is identified. CTA head: 1. Long segment of near occlusion/severe stenosis of right PICA. 2. Severe right and mild left P2 stenosis. 3. No additional intracranial large vessel occlusion, aneurysm, or significant stenosis is identified. 4. Infarction of right greater than left cerebellar hemispheres is stable in distribution, however, associated edema and mass effect is increased. There is complete effacement of fourth ventricle and partial effacement of prepontine cistern. Additionally, there is early superior transtentorial herniation and cerebellar tonsillar herniation. 5. Interval placement of right frontal approach ventriculostomy catheter with tip in the third ventricle. Decreased size of lateral and third ventricles. These results will be called to the ordering clinician or representative by the Radiologist Assistant, and communication documented in the PACS or zVision Dashboard. Electronically Signed   By: Kristine Garbe M.D.   On: 01/17/2018 22:14   Ct Head Wo Contrast  Result Date: 02/10/2018 CLINICAL DATA:  Stroke follow-up EXAM: CT HEAD WITHOUT CONTRAST TECHNIQUE: Contiguous axial images were obtained from the base of the skull through the vertex without intravenous contrast. COMPARISON:  CTA head neck 01/17/2018 FINDINGS: Brain: Persistent edema within the right greater than left cerebellar hemispheres with continued effacement of basal cisterns. Right frontal approach ventriculostomy catheter is unchanged in position with tip in the third ventricle. Dilatation of the lateral and third ventricles has decreased from the prior study. Mild degree of cerebellar  tonsillar herniation is unchanged. No acute hemorrhage. Vascular: No abnormal hyperdensity of the major intracranial arteries or dural venous sinuses. No intracranial atherosclerosis. Skull: The visualized skull base, calvarium and extracranial soft tissues are normal. Sinuses/Orbits: No fluid levels or advanced mucosal thickening  of the visualized paranasal sinuses. No mastoid or middle ear effusion. The orbits are normal. IMPRESSION: 1. Persistent edema within the right greater than left cerebellar hemispheres with continued effacement of basal cisterns. 2. Decreased size of the shunted lateral and third ventricles. 3. Unchanged mild cerebellar tonsillar herniation. 4. No acute hemorrhage. Electronically Signed   By: Ulyses Jarred M.D.   On: 02/04/2018 05:27   Ct Angio Neck W Or Wo Contrast  Result Date: 02/01/2018 CLINICAL DATA:  63 y/o M; probable right PICA stroke post decompression for hydrocephalus for follow-up. EXAM: CT ANGIOGRAPHY HEAD AND NECK TECHNIQUE: Multidetector CT imaging of the head and neck was performed using the standard protocol during bolus administration of intravenous contrast. Multiplanar CT image reconstructions and MIPs were obtained to evaluate the vascular anatomy. Carotid stenosis measurements (when applicable) are obtained utilizing NASCET criteria, using the distal internal carotid diameter as the denominator. CONTRAST:  121mL ISOVUE-370 IOPAMIDOL (ISOVUE-370) INJECTION 76% COMPARISON:  02/06/2018 CT head. FINDINGS: CTA NECK FINDINGS Aortic arch: Standard branching. Imaged portion shows no evidence of aneurysm or dissection. No significant stenosis of the major arch vessel origins. Right carotid system: No evidence of dissection, stenosis (50% or greater) or occlusion. Left carotid system: No evidence of dissection, stenosis (50% or greater) or occlusion. Vertebral arteries: Codominant. No evidence of dissection, stenosis (50% or greater) or occlusion. Skeleton: C2-C6 DISH.  Interbody fusion of C5-C7 on degenerative basis. No high-grade bony spinal canal stenosis. Other neck: Endotracheal tube and enteric tube extending below the field of view into the thorax. Left central venous catheter is extending below the field of view into the thorax. Upper chest: Negative. Review of the MIP images confirms the above findings CTA HEAD FINDINGS Anterior circulation: No significant stenosis, proximal occlusion, aneurysm, or vascular malformation. Non stenotic calcific atherosclerosis of the carotid siphons. Posterior circulation: Long segment of near occlusion/severe stenosis of the right PICA (series 7 image 134-145). Severe right P2 stenosis. Mild left P2 stenosis. No additional large vessel occlusion, aneurysm, or significant stenosis is identified. Venous sinuses: As permitted by contrast timing, patent. Anatomic variants: Complete circle-of-Willis. Delayed phase: Interval placement of a right frontal approach ventriculostomy catheter with tip in the third ventricle traversing the right foramen of Monro. Decreased size of the lateral and third ventricles, for example the third ventricle measures 14 mm medial-lateral, previously 17 mm. Infarction of the right greater than left cerebellar hemispheres is stable in distribution, however, associated edema and mass effect is increased with complete effacement of fourth ventricle, partial effacement of prepontine cistern, as well as early superior transtentorial herniation and cerebellar tonsillar herniation. Review of the MIP images confirms the above findings IMPRESSION: CTA neck: 1. Patent carotid and vertebral arteries of the neck. No large vessel occlusion, aneurysm, or significant stenosis by NASCET criteria is identified. CTA head: 1. Long segment of near occlusion/severe stenosis of right PICA. 2. Severe right and mild left P2 stenosis. 3. No additional intracranial large vessel occlusion, aneurysm, or significant stenosis is identified. 4.  Infarction of right greater than left cerebellar hemispheres is stable in distribution, however, associated edema and mass effect is increased. There is complete effacement of fourth ventricle and partial effacement of prepontine cistern. Additionally, there is early superior transtentorial herniation and cerebellar tonsillar herniation. 5. Interval placement of right frontal approach ventriculostomy catheter with tip in the third ventricle. Decreased size of lateral and third ventricles. These results will be called to the ordering clinician or representative by the Radiologist Assistant, and communication documented  in the PACS or zVision Dashboard. Electronically Signed   By: Kristine Garbe M.D.   On: 01/17/2018 22:14   Mr Brain Wo Contrast  Result Date: 02/08/2018 CLINICAL DATA:  Stroke follow-up EXAM: MRI HEAD WITHOUT CONTRAST TECHNIQUE: Multiplanar, multiecho pulse sequences of the brain and surrounding structures were obtained without intravenous contrast. COMPARISON:  Head CT from yesterday FINDINGS: Brain: Acute cerebellar infarct with extensive edema and petechial hemorrhage. The fourth ventricle is completely effaced with EVD in place. Continued decrease in lateral ventricular volume with no residual ventriculomegaly. Mild FLAIR hyperintensity around the lateral ventricles that is decreasing. Mild edematous signal or fluid around the EVD catheter. Minimal blood products layering in the occipital horns of the lateral ventricles. There is still complete effacement of cisterns in the posterior fossa. Persistent brainstem distortion without infarct or brainstem hemorrhage. Trace subdural hygroma posterior to the right cerebellar hemisphere. No pseudomeningocele. Vascular: Major flow voids are preserved Skull and upper cervical spine: Suboccipital craniectomy. Sinuses/Orbits: Intubation with layering fluid in the pharynx. Mild mucosal thickening in paranasal sinuses. IMPRESSION: 1. Acute  cerebellar infarct requiring interval suboccipital craniectomy. Improved foramen magnum patency but still prominent brainstem mass effect. No brainstem hemorrhage or infarct. 2. Continued normalization of lateral ventricular volume and periventricular edema when compared to CT yesterday. Electronically Signed   By: Monte Fantasia M.D.   On: 02/08/2018 12:40   Dg Chest Port 1 View  Result Date: 02/21/2018 CLINICAL DATA:  Respiratory failure.  Endotracheal tube placement. EXAM: PORTABLE CHEST 1 VIEW COMPARISON:  One-view chest x-ray 02/08/2018 FINDINGS: Heart size is normal. Marked prominence of the aortic arch is again seen. Endotracheal tube is now directed towards the right mainstem bronchus, near the level of the carina. A left subclavian line is stable. Improving aeration is present at the right base. A small right pleural effusion remains. Minimal atelectasis at the base is stable. IMPRESSION: 1. Endotracheal tube terminates near the carina, directed at the right mainstem bronchus. It could be pulled back 2-3 cm for more optimal positioning. 2. Improving aeration at the right base with residual right middle or lower lobe airspace disease and near complete resolution of pleural effusion. 3. Stable left atelectasis. 4. Stable prominence of the aortic arch, concerning for aneurysm. These results will be called to the ordering clinician or representative by the Radiologist Assistant, and communication documented in the PACS or zVision Dashboard. Electronically Signed   By: San Morelle M.D.   On: 02-21-18 08:02   Dg Chest Port 1 View  Result Date: 02/08/2018 CLINICAL DATA:  Status post intubation. EXAM: PORTABLE CHEST 1 VIEW COMPARISON:  02/08/2018 FINDINGS: The endotracheal tube tip is above the carina. There is a left subclavian catheter with tip projecting over the SVC. The enteric tube is looped within the stomach. Normal heart size. Right pleural effusion with overlying opacification of the  right base appears unchanged from previous exam. Left lung is clear. IMPRESSION: 1. Right pleural effusion with unchanged aeration to the right lung base. Electronically Signed   By: Kerby Moors M.D.   On: 02/08/2018 08:04   Dg Chest Port 1 View  Result Date: 01/16/2018 CLINICAL DATA:  Endotracheal tube. EXAM: PORTABLE CHEST 1 VIEW COMPARISON:  Chest x-rays dated 01/16/2018 and 07/10/2015. FINDINGS: Endotracheal tube is well positioned with tip just above the level the carina. LEFT subclavian central line is adequately positioned with tip at the level of the mid SVC. Enteric tube passes below the diaphragm. Heart size and mediastinal contours are stable. New  opacity at the RIGHT lung base, atelectasis versus aspiration. LEFT lung remains clear. No pneumothorax seen. IMPRESSION: 1. New opacity at the RIGHT lung base, atelectasis versus aspiration. 2. Endotracheal tube well positioned with tip just above the level of the carina. Electronically Signed   By: Franki Cabot M.D.   On: 02/08/2018 07:16   Dg Chest Port 1 View  Result Date: 01/15/2018 CLINICAL DATA:  Central line and orogastric tube placement. EXAM: PORTABLE CHEST 1 VIEW COMPARISON:  800 hours FINDINGS: Left subclavian central venous catheter place with its tip at the lower SVC. There is no pneumothorax. NG tube placed with its tip coiled in the fundus. Endotracheal tube is stable. Upper normal heart size. Clear lungs. IMPRESSION: Left subclavian vein central venous catheter placed with its tip at the lower SVC and no pneumothorax. NG tube coiled in the stomach. Stable endotracheal tube. Clear lungs. Electronically Signed   By: Marybelle Killings M.D.   On: 01/23/2018 11:13   Dg Chest Portable 1 View  Result Date: 01/31/2018 CLINICAL DATA:  Hypoxia EXAM: PORTABLE CHEST 1 VIEW COMPARISON:  July 10, 2015 FINDINGS: Endotracheal tube tip is 2.9 cm above the carina. No pneumothorax. There is no edema or consolidation. Heart is upper normal in size with  pulmonary vascularity normal. No adenopathy no bone lesions. IMPRESSION: Endotracheal tube as described without pneumothorax. No edema or consolidation. Stable cardiac silhouette. Electronically Signed   By: Lowella Grip III M.D.   On: 02/08/2018 08:27   Ct Head Code Stroke Wo Contrast  Result Date: 02/08/2018 CLINICAL DATA:  Code stroke.  Unresponsive EXAM: CT HEAD WITHOUT CONTRAST TECHNIQUE: Contiguous axial images were obtained from the base of the skull through the vertex without intravenous contrast. COMPARISON:  CT head 07/10/2015 FINDINGS: Brain: Extensive hypodensity in the right inferior cerebellum compatible with subacute infarct. Infarct crosses the midline to the left cerebellum. Inferior vermis involved with infarct. Large amount of edema and mass-effect. Obstructive hydrocephalus. Third and fourth ventricles and aqueduct are diffusely dilated. Fourth ventricle mildly dilated. Negative for acute hemorrhage.  No other area of acute infarct. Vascular: Hyperdensity of the basilar is slightly greater than the density of the carotid. This could be due to basilar thrombosis however this could be due to blood pool effect. Skull: Negative Sinuses/Orbits: Negative Other: None ASPECTS ([place] Stroke Program Early CT Score) - Ganglionic level infarction (caudate, lentiform nuclei, internal capsule, insula, M1-M3 cortex): 7 - Supraganglionic infarction (M4-M6 cortex): 3 Total score (0-10 with 10 being normal): 10 IMPRESSION: 1. Acute/subacute infarct in the cerebellum bilaterally right greater than left. Extensive edema is causing moderate obstructive hydrocephalus. 2. Hyperdense basilar raises the possibility of thrombus however this could be blood pool effect. 3. ASPECTS is 10. 4. These results were called by telephone at the time of interpretation on 01/25/2018 at 8:37 am to Dr. Cheral Marker , who verbally acknowledged these results. Electronically Signed   By: Franchot Gallo M.D.   On: 02/08/2018 08:38     Microbiology Recent Results (from the past 240 hour(s))  MRSA PCR Screening     Status: None   Collection Time: 02/05/2018  9:48 AM  Result Value Ref Range Status   MRSA by PCR NEGATIVE NEGATIVE Final    Comment:        The GeneXpert MRSA Assay (FDA approved for NASAL specimens only), is one component of a comprehensive MRSA colonization surveillance program. It is not intended to diagnose MRSA infection nor to guide or monitor treatment for MRSA infections. Performed  at [facility], [place] [address] [place], [place] [address]   Culture, blood (routine x 2)     Status: None (Preliminary result)   Collection Time: 02/13/2018  9:34 AM  Result Value Ref Range Status   Specimen Description BLOOD LEFT THUMB  Final   Special Requests   Final    BOTTLES DRAWN AEROBIC ONLY Blood Culture adequate volume   Culture   Final    NO GROWTH 2 DAYS Performed at [facility], [place] [address], [place], [place] [address]    Report Status PENDING  Incomplete  Culture, blood (routine x 2)     Status: None (Preliminary result)   Collection Time: 01/18/2018  9:36 AM  Result Value Ref Range Status   Specimen Description BLOOD LEFT HAND  Final   Special Requests   Final    BOTTLES DRAWN AEROBIC ONLY Blood Culture adequate volume   Culture   Final    NO GROWTH 2 DAYS Performed at [facility], [place] [address] [place], [place] [address]    Report Status PENDING  Incomplete  Culture, respiratory (non-expectorated)     Status: None   Collection Time: 02/13/2018  3:34 PM  Result Value Ref Range Status   Specimen Description TRACHEAL ASPIRATE  Final   Special Requests   Final    Normal Performed at [facility], [place] [address], [place], [place] [address]    Gram Stain   Final    FEW WBC PRESENT, PREDOMINANTLY PMN FEW GRAM POSITIVE COCCI    Culture   Final    MODERATE HAEMOPHILUS INFLUENZAE BETA LACTAMASE NEGATIVE    Report Status 03/08/18 FINAL  Final  Surgical pcr  screen     Status: None   Collection Time: 02/11/2018  3:56 PM  Result Value Ref Range Status   MRSA, PCR NEGATIVE NEGATIVE Final   Staphylococcus aureus NEGATIVE NEGATIVE Final    Comment: (NOTE) The Xpert SA Assay (FDA approved for NASAL specimens in patients 87 years of age and older), is one component of a comprehensive surveillance program. It is not intended to diagnose infection nor to guide or monitor treatment. Performed at [facility], [place] [address], [place], [place] [address]     Lab Basic Metabolic Panel: Recent Labs  Lab 01/19/2018 [id number]  01/20/2018 8416  01/31/2018 6063 01/16/2018 0949  01/14/2018 1620  02/08/2018 0358 02/03/2018 0500  01/19/2018 1611  02/08/18 0500 02/08/18 1250 02/08/18 1745 03/08/18 0000 03-08-18 0500 2018-03-08 1020  NA 140   < >  --   --  140 139   < >  --    < > 149* 148*   < >  --    < > 156* 158* 161* 164* 162* 162*  K 4.2   < >  --   --   --  3.6  --   --   --  3.6 3.7  --   --   --  3.7  --   --   --  3.7  --   CL 103  --   --   --   --   --   --   --   --   --  119*  --   --   --  127*  --   --   --  >130*  --   CO2 25  --   --   --   --   --   --   --   --   --  22  --   --   --  22  --   --   --  23  --   GLUCOSE 183*  --   --   --   --   --   --   --   --   --  171*  --   --   --  261*  --   --   --  189*  --   BUN 19  --   --   --   --   --   --   --   --   --  29*  --   --   --  25*  --   --   --  30*  --   CREATININE 1.50*  --  1.40*  --   --   --   --   --   --   --  1.53*  --   --   --  1.49*  --   --   --  1.75*  --   CALCIUM 9.0  --   --   --   --   --   --   --   --   --  8.4*  --   --   --  8.5*  --   --   --  8.4*  --   MG  --   --   --    < > 1.9  --   --  2.1  --   --  2.1  --  2.2  --  2.2  --   --   --  2.3  --   PHOS  --   --   --   --  2.6  --   --  2.5  --   --  2.2*  --  1.7*  --   --   --   --   --   --   --    < > = values in this interval not displayed.   Liver Function Tests: Recent Labs  Lab 01/29/2018 0754  AST 40   ALT 14  ALKPHOS 56  BILITOT 1.1  PROT 7.3  ALBUMIN 3.5   No results for input(s): LIPASE, AMYLASE in the last 168 hours. No results for input(s): AMMONIA in the last 168 hours. CBC: Recent Labs  Lab 01/23/2018 0754  02/04/2018 0949 02/13/2018 0358 01/21/2018 0500 02/08/18 0500 Feb 27, 2018 0500  WBC 17.0*  --   --   --  19.3* 20.3* 21.4*  NEUTROABS 14.8*  --   --   --   --   --   --   HGB 15.0   < > 14.3 13.9 14.0 13.6 12.5*  HCT 47.6   < > 42.0 41.0 44.4 42.6 41.9  MCV 89.1  --   --   --  89.5 91.0 93.1  PLT 249  --   --   --  193 159 178   < > = values in this interval not displayed.   Cardiac Enzymes: No results for input(s): CKTOTAL, CKMB, CKMBINDEX, TROPONINI in the last 168 hours. Sepsis Labs: Recent Labs  Lab 02/12/2018 0754 02/12/2018 0500 02/08/18 0500 27-Feb-2018 0500  [place]  --   --  2.34 2.01  WBC 17.0* 19.3* 20.3* 21.4*    Procedures/Operations  EVD Suboccipital decompression   Rosalin Hawking 02-27-2018, 6:56 PM

## 2018-02-14 NOTE — Progress Notes (Signed)
Pt extubated using withdrawal guidelines.  RN & NP @ bedside.

## 2018-02-14 NOTE — Progress Notes (Signed)
No charge note:   Patient noted to have asystole per monitor. Myself and patient's RN- Julieanne Cotton confirmed patient with no respirations and no heartsounds.  Death confirmed at 78.  Mariana Kaufman, AGNP-C Palliative Medicine  Please call Palliative Medicine team phone with any questions [phone]. For individual providers please see AMION.

## 2018-02-14 NOTE — Progress Notes (Signed)
ETT tube was pulled approx at 1615. Patient went asystole at 1634. Mariana Kaufman, NP and Julieanne Cotton, RN pronounced patient at that time. Family visiting with patient.

## 2018-02-14 NOTE — Progress Notes (Signed)
STROKE TEAM PROGRESS NOTE   SUBJECTIVE (INTERVAL HISTORY) His wife and daughter are at the bedside.  Pt still intubated, not open eyes for pain, disconjugate eyes, absence of corneal or gag or cough, not moving all extremities on pain.  OBJECTIVE Vitals:   13-Feb-2018 1100 02-13-18 1200 02-13-2018 1226 2018-02-13 1300  BP: (!) 171/106 (!) 161/103 (!) 161/103 (!) 165/101  Pulse: (!) 104 97 93 99  Resp: 11 13 13 13   Temp:      TempSrc:      SpO2: 99% 100% 100% 100%  Weight:      Height:        CBC:  Recent Labs  Lab 01/21/2018 0754  02/08/18 0500 02/13/2018 0500  WBC 17.0*   < > 20.3* 21.4*  NEUTROABS 14.8*  --   --   --   HGB 15.0   < > 13.6 12.5*  HCT 47.6   < > 42.6 41.9  MCV 89.1   < > 91.0 93.1  PLT 249   < > 159 178   < > = values in this interval not displayed.    Basic Metabolic Panel:  Recent Labs  Lab 02/06/2018 0500  02/11/2018 1611  02/08/18 0500  02-13-2018 0500 02-13-2018 1020  NA 148*   < >  --    < > 156*   < > 162* 162*  K 3.7  --   --   --  3.7  --  3.7  --   CL 119*  --   --   --  127*  --  >130*  --   CO2 22  --   --   --  22  --  23  --   GLUCOSE 171*  --   --   --  261*  --  189*  --   BUN 29*  --   --   --  25*  --  30*  --   CREATININE 1.53*  --   --   --  1.49*  --  1.75*  --   CALCIUM 8.4*  --   --   --  8.5*  --  8.4*  --   MG 2.1  --  2.2  --  2.2  --  2.3  --   PHOS 2.2*  --  1.7*  --   --   --   --   --    < > = values in this interval not displayed.    Lipid Panel:     Component Value Date/Time   CHOL 128 02/08/2018 0500   TRIG 152 (H) 02/08/2018 0500   HDL 30 (L) 02/08/2018 0500   CHOLHDL 4.3 02/08/2018 0500   VLDL 30 02/08/2018 0500   LDLCALC 68 02/08/2018 0500   HgbA1c:  Lab Results  Component Value Date   HGBA1C 8.1 (H) 01/27/2018   Urine Drug Screen:     Component Value Date/Time   LABOPIA NONE DETECTED 01/27/2018 1003   COCAINSCRNUR NONE DETECTED 02/02/2018 1003   LABBENZ POSITIVE (A) 02/01/2018 1003   AMPHETMU NONE DETECTED  02/11/2018 1003   THCU NONE DETECTED 01/19/2018 1003   LABBARB NONE DETECTED 01/25/2018 1003    Alcohol Level     Component Value Date/Time   ETH <10 01/15/2018 0754    IMAGING  Ct Angio Head W Or Wo Contrast Ct Angio Neck W Or Wo Contrast 02/10/2018 IMPRESSION:   CTA neck:  1. Patent carotid and vertebral arteries of the  neck. No large vessel occlusion, aneurysm, or significant stenosis by NASCET criteria is identified.   CTA head:  1. Long segment of near occlusion/severe stenosis of right PICA.  2. Severe right and mild left P2 stenosis.  3. No additional intracranial large vessel occlusion, aneurysm, or significant stenosis is identified.  4. Infarction of right greater than left cerebellar hemispheres is stable in distribution, however, associated edema and mass effect is increased. There is complete effacement of fourth ventricle and partial effacement of prepontine cistern. Additionally, there is early superior transtentorial herniation and cerebellar tonsillar herniation.  5. Interval placement of right frontal approach ventriculostomy catheter with tip in the third ventricle. Decreased size of lateral and third ventricles.    Ct Head Wo Contrast 02/01/2018 IMPRESSION:  1. Persistent edema within the right greater than left cerebellar hemispheres with continued effacement of basal cisterns.  2. Decreased size of the shunted lateral and third ventricles.  3. Unchanged mild cerebellar tonsillar herniation.  4. No acute hemorrhage.    Dg Chest Port 1 View 01/23/2018 IMPRESSION:  1. New opacity at the RIGHT lung base, atelectasis versus aspiration.  2. Endotracheal tube well positioned with tip just above the level of the carina.   Dg Chest Port 1 View 02/05/2018 IMPRESSION:  Left subclavian vein central venous catheter placed with its tip at the lower SVC and no pneumothorax. NG tube coiled in the stomach. Stable endotracheal tube. Clear lungs.   Dg Chest Portable 1  View 01/15/2018 IMPRESSION:  Endotracheal tube as described without pneumothorax. No edema or consolidation. Stable cardiac silhouette.    Ct Head Code Stroke Wo Contrast 01/21/2018 IMPRESSION:  1. Acute/subacute infarct in the cerebellum bilaterally right greater than left. Extensive edema is causing moderate obstructive hydrocephalus.  2. Hyperdense basilar raises the possibility of thrombus however this could be blood pool effect.  3. ASPECTS is 10.    Transthoracic Echocardiogram  02/08/2018 Study Conclusions  - Left ventricle: The cavity size was normal. Wall thickness was   increased in a pattern of moderate LVH. Systolic function was   normal. The estimated ejection fraction was in the range of 60%   to 65%. Diastolic function is abnormal, indeterminant grade. - Aorta: Aortic root dimension: 41 mm (ED). Ascending aortic   diameter: 40 mm (S). - Aortic root: The aortic root was mildly dilated. - Atrial septum: Cannot exclude small PFO. - Technically difficult study.  Mr Brain Wo Contrast  Result Date: 02/08/2018 CLINICAL DATA:  Stroke follow-up EXAM: MRI HEAD WITHOUT CONTRAST TECHNIQUE: Multiplanar, multiecho pulse sequences of the brain and surrounding structures were obtained without intravenous contrast. COMPARISON:  Head CT from yesterday FINDINGS: Brain: Acute cerebellar infarct with extensive edema and petechial hemorrhage. The fourth ventricle is completely effaced with EVD in place. Continued decrease in lateral ventricular volume with no residual ventriculomegaly. Mild FLAIR hyperintensity around the lateral ventricles that is decreasing. Mild edematous signal or fluid around the EVD catheter. Minimal blood products layering in the occipital horns of the lateral ventricles. There is still complete effacement of cisterns in the posterior fossa. Persistent brainstem distortion without infarct or brainstem hemorrhage. Trace subdural hygroma posterior to the right cerebellar  hemisphere. No pseudomeningocele. Vascular: Major flow voids are preserved Skull and upper cervical spine: Suboccipital craniectomy. Sinuses/Orbits: Intubation with layering fluid in the pharynx. Mild mucosal thickening in paranasal sinuses. IMPRESSION: 1. Acute cerebellar infarct requiring interval suboccipital craniectomy. Improved foramen magnum patency but still prominent brainstem mass effect. No brainstem hemorrhage or infarct. 2. Continued  normalization of lateral ventricular volume and periventricular edema when compared to CT yesterday. Electronically Signed   By: Monte Fantasia M.D.   On: 02/08/2018 12:40    PHYSICAL EXAM  Temp:  [99 F (37.2 C)-100.5 F (38.1 C)] 100.2 F (37.9 C) (01/27 0800) Pulse Rate:  [90-105] 99 (01/27 1300) Resp:  [11-17] 13 (01/27 1300) BP: (160-177)/(95-108) 165/101 (01/27 1300) SpO2:  [98 %-100 %] 100 % (01/27 1300) FiO2 (%):  [40 %] 40 % (01/27 1226) Weight:  [85 kg] 85 kg (01/27 0453)  General - Well nourished, well developed, intubated on sedation.  Ophthalmologic - fundi not visualized due to noncooperation.  Cardiovascular - Regular rate and rhythm.  Neuro - intubated on sedation, eyes closed, not open on strong pain stimulation with voice, not following commands. With forced eye opening, eyes disconjugate bilateral abduction deficit, not blinking to visual threat, doll's eyes present, not tracking, PERRL. Corneal reflex absent, gag and cough absent. Facial symmetry not able to test due to ET tube.  Tongue midline in mouth. On pain stimulation, no movement in all extremities. DTR diminished and no babinski. Sensation, coordination and gait not tested.    ASSESSMENT/PLAN Mr. JAYLEND REILAND is a 63 y.o. male with history of DM, Htn and gout presenting with dizziness, N&V, unsteady gait -> unresponsiveness for which he was intubated. He did not receive IV t-PA due to late presentation.  Stroke: Bilateral cerebellar infarcts, R>L - embolic - source  unknown  Resultant  Obtunded   CT head - Acute/subacute infarct in the cerebellum bilaterally right greater than left. Extensive edema is causing moderate obstructive hydrocephalus.    CTA H&N - Long segment of near occlusion/severe stenosis of right PICA. Severe right and mild left P2 stenosis.   2D Echo  - EF 60 - 65%. Cannot exclude small PFO.  LDL - 68  HgbA1c - 8.1  UDS - Benzodiazepines   VTE prophylaxis - SCDs  Diet - NPO - tube feedings  No antithrombotic prior to admission, now on aspirin 325.  Disposition:  Discussed with wife and daughter at the bedside, given poor prognosis, family requested palliative care consult.  Cerebral edema with brainstem compression  Repeat CT showed b/l cerebellar edema with IV ventricle effacement   Diminished basal cistern with tonsillar herniation   NSG Dr. Kathyrn Sheriff consulted  Status post suboccipital decompression 01/17/2018  Developed to disconjugate gaze, absent corneal and gag, no movement of all extremities.  MRI bilateral cerebellar swelling with brainstem compression and mass-effect, petechial hemorrhage throughout bilateral cerebellum  Na - 156->158->158->162  On 3% saline  Obstructive hydrocephalus  CT showed severe obstructive hydrocephalus  S/p EVD with Dr. Kathyrn Sheriff  Repeat CT much improved hydrocephalus  MRI also shows stable ventricle size and improved hydrocephalus  Patent drainage, pressure set at 5 cm water  Fever with leukocytosis  Tmax 103.1-101.1 -> 98.7->100.5  Leukocytosis 17.0->19.3->20.3->21.4  UA negative  BCx pending  Sputum culture pending  On unasyn started 01/18/2018  CCM on board  Hypertension  Stable - off Cleviprex . Permissive hypertension (OK if < 180/105) but gradually normalize in 5-7 days . Long-term BP goal normotensive  Hyperlipidemia  Lipid lowering medication PTA:  Pravachol 20 mg daily  LDL - 68, goal < 70  Current lipid lowering medication: pravachol  20  Continue statin at discharge  Diabetes  HgbA1c 8.1, goal < 7.0  Uncontrolled  Hyperglycemia  Lantus 15 units  NovoLog 3 units every 4  SSI  CBG monitoring  Other  Stroke Risk Factors  Advanced age  Other Active Problems  CKD stage III - Cre. 1.50->1.40->1.53->1.49->1.75  Hospital day # 3  This patient is critically ill due to bilateral cerebellar infarcts, hydrocephalus s/p EVD, posterior fossa edema, fever, HTN hyperglycemai and at significant risk of neurological worsening, death form recurrent stroke, hemorrhagic conversion, brainstem compression, brain herniation, seizure, DKA, heart failure, sepsis. This patient's care requires constant monitoring of vital signs, hemodynamics, respiratory and cardiac monitoring, review of multiple databases, neurological assessment, discussion with family, other specialists and medical decision making of high complexity. I spent 45 minutes of neurocritical care time in the care of this patient. I had long discussion with wife and daughter at bedside, updated pt current condition, treatment plan and poor prognosis. They are leaning toward palliative care but would like to have palliative care discussion first. Palliative care consult placed.   Rosalin Hawking, MD PhD Stroke Neurology 02/13/18 1:48 PM  To contact Stroke Continuity provider, please refer to [email or web address] After hours, contact General Neurology

## 2018-02-14 NOTE — Progress Notes (Signed)
  NEUROSURGERY PROGRESS NOTE   No issues overnight.  Remains intubated.  EXAM:  BP (!) 166/108   Pulse 98   Temp 99.1 F (37.3 C) (Oral)   Resp 14   Ht 6' (1.829 m)   Wt 85 kg   SpO2 100%   BMI 25.41 kg/m   Intuabted Pupils reactive EVD in place, clear CSF. 216cc overnight. Crani site without drainage  IMPRESSION/PLAN 63 y.o. male   s/p cerebellar stroke POD#2 pos fossa decompression. Likely pneumonia. Worsening neuro exam since yesterday.  - Continue supportive care, EVD

## 2018-02-14 NOTE — Consult Note (Signed)
Consultation Note Date: Mar 05, 2018   Patient Name: Jared Vazquez  DOB: [birth year]  MRN: [id number]  Age / Sex: 63 y.o., male  PCP: Iona Beard, MD Referring Physician: Rosalin Hawking, MD  Reason for Consultation: Establishing goals of care and Withdrawal of life-sustaining treatment  HPI/Patient Profile: 63 y.o. male  with past medical history of diabetes and hypertension admitted on 02/02/2018 after being found unresponsive at home. Workup revealed large left posterior circulation stroke with hydrocephalus. He required intubation for airway support. He had IVD placed on 1/24. Nuerosurgery was consulted and proceed with suboccipital decompression. MRI 1/26 showed continued edema with brainstem mass effect. Patient's clinical neuro exam worsened with no gag or eye threat reflex, not responsive to pain stimulation. Not waking up, on no sedation. Chest xray also revealing pnuemonia with respiratory culture positive for H.Flu. Creatinine trending up. Palliative medicine consulted for [place].      Clinical Assessment and Goals of Care:  I have reviewed medical records including EPIC notes, labs and imaging, received report from Dr. Erlinda Hong, assessed the patient and then met at the bedside along with patient's wife- [name], and daughter [name]  to discuss diagnosis prognosis, GOC, EOL wishes, disposition and options.  I introduced Palliative Medicine as specialized medical care for people living with serious illness. It focuses on providing relief from the symptoms and stress of a serious illness. The goal is to improve quality of life for both the patient and the family.  We discussed a brief life review of the patient. 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 He enjoyed working outside and being active.  As far as functional and nutritional status- prior to this event he was in excellent condition, he  was cutting down trees last week.   We discussed his current illness and what it means in the larger context of his on-going co-morbidities.  Natural disease trajectory and expectations at EOL were discussed. I attempted to elicit values and goals of care important to the patient.   The difference between aggressive medical intervention and comfort care was considered in light of the patient's goals of care.   [name] is confident in her belief that patient would not want his life prolonged with the knowledge that he would continue to live in a debilitated state. She believes that transition to full comfort measures only would be patient's preferred GOC.   Advanced directives, concepts specific to code status, artifical feeding and hydration, and rehospitalization were considered and discussed. Patient will be DNR, stop tube feedings and hydration, discontinue mechanical ventilation.   Hospice and Palliative Care services outpatient were explained and offered. We discussed moving patient to [facility] after extubation if he looks to breathe on his own and is stable.   Questions and concerns were addressed.  Primary Decision Maker NEXT OF KIN- patient's spouse- [name]    SUMMARY OF RECOMMENDATIONS -DNR -Extubate to full comfort measures only -Morphine 35m/hr infusion with bolus q5 min prn -Robinul as needed -Lorazepam [id number] prn  Code Status/Advance Care Planning:  DNR  Palliative Prophylaxis:   Frequent Pain Assessment  Additional Recommendations (Limitations, Scope, Preferences):  Full Comfort Care  Prognosis:    Hours - Days  Discharge Planning: Anticipated Hospital Death  Primary Diagnoses: Present on Admission: . Cerebellar stroke ([place]) . Hydrocephalus ([place])   I have reviewed the medical record, interviewed the patient and family, and examined the patient. The following aspects are pertinent.  Past Medical History:  Diagnosis Date  . Diabetes mellitus without  complication ([place])   . Gout   . Hypertension    Social History   Socioeconomic History  . Marital status: Married    Spouse name: Not on file  . Number of children: Not on file  . Years of education: Not on file  . Highest education level: Not on file  Occupational History  . Not on file  Social Needs  . Financial resource strain: Not on file  . Food insecurity:    Worry: Not on file    Inability: Not on file  . Transportation needs:    Medical: Not on file    Non-medical: Not on file  Tobacco Use  . Smoking status: Never Smoker  . Smokeless tobacco: Never Used  Substance and Sexual Activity  . Alcohol use: Yes    Comment: occasionally  . Drug use: No  . Sexual activity: Not on file  Lifestyle  . Physical activity:    Days per week: Not on file    Minutes per session: Not on file  . Stress: Not on file  Relationships  . Social connections:    Talks on phone: Not on file    Gets together: Not on file    Attends religious service: Not on file    Active member of club or organization: Not on file    Attends meetings of clubs or organizations: Not on file    Relationship status: Not on file  Other Topics Concern  . Not on file  Social History Narrative  . Not on file   Family History  Problem Relation Age of Onset  . High blood pressure Mother   . High blood pressure Father    Scheduled Meds: . aspirin EC  325 mg Oral Daily  . chlorhexidine gluconate (MEDLINE KIT)  15 mL Mouth Rinse BID  . Chlorhexidine Gluconate Cloth  6 each Topical Daily  . feeding supplement (PRO-STAT SUGAR FREE 64)  30 mL Per Tube Daily  . fentaNYL (SUBLIMAZE) injection  50 mcg Intravenous Once  . insulin aspart  0-15 Units Subcutaneous Q4H  . insulin aspart  3 Units Subcutaneous Q4H  . insulin glargine  15 Units Subcutaneous QHS  . mouth rinse  15 mL Mouth Rinse 10 times per day  . pantoprazole sodium  40 mg Per Tube Daily  . pravastatin  20 mg Oral Daily  . sodium chloride flush   10-40 mL Intracatheter Q12H   Continuous Infusions: . cefTRIAXone (ROCEPHIN)  IV    . feeding supplement (VITAL AF 1.2 CAL) Stopped (02/24/2018 1310)  . fentaNYL infusion INTRAVENOUS     PRN Meds:.acetaminophen (TYLENOL) oral liquid 160 mg/5 mL, fentaNYL, labetalol, sodium chloride flush Medications Prior to Admission:  Prior to Admission medications   Medication Sig Start Date End Date Taking? Authorizing Provider  acetaminophen (TYLENOL) 500 MG tablet Take 1,000 mg by mouth every 6 (six) hours as needed for mild pain.   Yes [provider]  allopurinol (ZYLOPRIM) 300 MG tablet Take 300  mg by mouth daily.   Yes [provider]  colchicine 0.6 MG tablet Take 0.6 mg by mouth daily as needed (gout flare ups).   Yes [provider]  metFORMIN (GLUCOPHAGE) 500 MG tablet Take 500 mg by mouth 2 (two) times daily.    Yes [provider]  Nebivolol HCl 20 MG TABS Take 20 mg by mouth daily.    Yes [provider]  pravastatin (PRAVACHOL) 20 MG tablet Take 20 mg by mouth daily.   Yes [provider]  TRIBENZOR 40-10-25 MG TABS Take 1 tablet by mouth daily. 11/21/14  Yes [provider]  naproxen (NAPROSYN) 500 MG tablet Take 1 tablet (500 mg total) by mouth 2 (two) times daily with a meal. Patient not taking: Reported on 02/11/2018 09/26/16   Sanjuana Kava, MD   No Known Allergies Review of Systems  Unable to perform ROS: Intubated    Physical Exam Vitals signs and nursing note reviewed.  Constitutional:      Appearance: He is normal weight.  Cardiovascular:     Rate and Rhythm: Tachycardia present.  Pulmonary:     Comments: intubated Neurological:     Comments: No response to voice or touch     Vital Signs: BP (!) 165/101   Pulse 99   Temp 100.2 F (37.9 C) (Axillary) Comment: tylenol given   Resp 13   Ht 6' (1.829 m)   Wt 85 kg   SpO2 100%   BMI 25.41 kg/m  Pain Scale: CPOT   Pain Score: 0-No pain   SpO2: SpO2:  100 % O2 Device:SpO2: 100 % O2 Flow Rate: .   IO: Intake/output summary:   Intake/Output Summary (Last 24 hours) at 2018-03-09 1318 Last data filed at 2018/03/09 0900 Gross per 24 hour  Intake 1422.69 ml  Output 1097 ml  Net 325.69 ml    LBM: Last BM Date: (PTA) Baseline Weight: Weight: 89.3 kg Most recent weight: Weight: 85 kg     Palliative Assessment/Data: PPS: 10%     Thank you for this consult. Palliative medicine will continue to follow and assist as needed.   Time In: 1230 Time Out: 1340 Time Total: 70 minutes Greater than 50%  of this time was spent counseling and coordinating care related to the above assessment and plan.  Signed by: Mariana Kaufman, AGNP-C Palliative Medicine    Please contact Palliative Medicine Team phone at [phone] for questions and concerns.  For individual provider: See Shea Evans

## 2018-02-14 DEATH — deceased
